# Patient Record
Sex: Female | Born: 1980 | Race: White | Hispanic: No | Marital: Married | Smoking: Former smoker
Health system: Southern US, Community
[De-identification: ages and names within clinical notes are randomized; demographics above are authoritative.]

## PROBLEM LIST (undated history)

## (undated) DIAGNOSIS — I499 Cardiac arrhythmia, unspecified: Secondary | ICD-10-CM

## (undated) DIAGNOSIS — R87619 Unspecified abnormal cytological findings in specimens from cervix uteri: Secondary | ICD-10-CM

## (undated) DIAGNOSIS — IMO0002 Reserved for concepts with insufficient information to code with codable children: Secondary | ICD-10-CM

## (undated) DIAGNOSIS — U071 COVID-19: Secondary | ICD-10-CM

## (undated) HISTORY — PX: WISDOM TOOTH EXTRACTION: SHX21

## (undated) HISTORY — DX: Unspecified abnormal cytological findings in specimens from cervix uteri: R87.619

## (undated) HISTORY — DX: Reserved for concepts with insufficient information to code with codable children: IMO0002

## (undated) HISTORY — DX: COVID-19: U07.1

---

## 2009-12-26 ENCOUNTER — Ambulatory Visit: Payer: Self-pay | Admitting: Obstetrics & Gynecology

## 2010-01-16 ENCOUNTER — Ambulatory Visit: Payer: Self-pay | Admitting: Obstetrics & Gynecology

## 2010-09-24 NOTE — Assessment & Plan Note (Signed)
[name], [name]             ACCOUNT NO.:  [id number]   MEDICAL RECORD NO.:  [id number]          PATIENT TYPE:  POB   LOCATION:  [facility] [place]         FACILITY:  [facility]   PHYSICIAN:  Allie Bossier, MD        DATE OF BIRTH:  1980/11/11   DATE OF SERVICE:  12/26/2009                                  CLINIC NOTE   Ms. Deborah Edwards is a 30 year old married white, gravida 3, para 3.  She has  sons that are ages 67 months 4 years and 8 years.  She comes in because  she would like to restart her birth control pills.  She had run out of  birth control pills last month and had unprotected intercourse on the  15th of this month and took the Plan B preparation on the 16th of this  month (yesterday).  She does desire children within the next 5 years,  but wants to start her birth control pills back as she is not pregnant.   PAST MEDICAL HISTORY:  None.   PAST SURGICAL HISTORY:  None.   SOCIAL HISTORY:  She drinks occasional alcohol, but she quit regular  smoking about 8 years ago.   REVIEW OF SYSTEMS:  She works at [employer] and she is married for the  last 5 years and she denies dyspareunia.   MEDICATIONS:  She is not currently taking any planning except the Plan B  yesterday and she would like to restart her Necon.   PHYSICAL EXAMINATION:  GENERAL:  Well-nourished, well-hydrated pleasant  white female.  VITAL SIGNS:  Height 5 feet 9 inches, weight 143 pounds, blood pressure  111/71, pulse 67.  HEENT:  Normal.  HEART:  Regular rate and rhythm.  LUNGS:  Clear to auscultation bilaterally.  ABDOMEN:  Scaphoid benign.  EXTERNAL GENITALIA:  No lesions.  Cervix parous.  She has an excellent  pelvis for giving birth.  Her uterus is normal size and shape,  anteverted, mobile.  Adnexa are nontender.  No masses.   ASSESSMENT AND PLAN:  1. Annual exam.  I have checked a Pap smear and recommended self-      breast and self-vulvar exams.  2. Birth control.  I have given her a prescription for  Necon.  She      will start this within the next day of her next      menstrual period.  If she does not start her period and her      pregnancy test is positive, she should return for prenatal care.      Allie Bossier, MD     MCD/MEDQ  D:  12/26/2009  T:  12/27/2009  Job:  [id number]

## 2010-10-21 ENCOUNTER — Ambulatory Visit (INDEPENDENT_AMBULATORY_CARE_PROVIDER_SITE_OTHER): Payer: Managed Care, Other (non HMO)

## 2010-10-21 ENCOUNTER — Other Ambulatory Visit: Payer: Self-pay | Admitting: Physician Assistant

## 2010-10-21 DIAGNOSIS — R87612 Low grade squamous intraepithelial lesion on cytologic smear of cervix (LGSIL): Secondary | ICD-10-CM

## 2010-10-21 DIAGNOSIS — Z3041 Encounter for surveillance of contraceptive pills: Secondary | ICD-10-CM

## 2010-10-21 DIAGNOSIS — Z113 Encounter for screening for infections with a predominantly sexual mode of transmission: Secondary | ICD-10-CM

## 2011-08-28 ENCOUNTER — Telehealth: Payer: Self-pay | Admitting: *Deleted

## 2011-08-28 ENCOUNTER — Emergency Department (INDEPENDENT_AMBULATORY_CARE_PROVIDER_SITE_OTHER)
Admission: EM | Admit: 2011-08-28 | Discharge: 2011-08-28 | Disposition: A | Discharge status: Home / Self Care | Payer: Managed Care, Other (non HMO) | Source: Home / Self Care | Attending: Emergency Medicine | Admitting: Emergency Medicine

## 2011-08-28 ENCOUNTER — Encounter: Payer: Self-pay | Admitting: *Deleted

## 2011-08-28 DIAGNOSIS — R319 Hematuria, unspecified: Secondary | ICD-10-CM

## 2011-08-28 DIAGNOSIS — N390 Urinary tract infection, site not specified: Secondary | ICD-10-CM

## 2011-08-28 HISTORY — DX: Cardiac arrhythmia, unspecified: I49.9

## 2011-08-28 LAB — POCT URINALYSIS DIP (MANUAL ENTRY)
Nitrite, UA: NEGATIVE
Spec Grav, UA: >=1.03 (ref 1.005–1.03)
Urobilinogen, UA: 0.2 (ref 0–1)
pH, UA: 6 (ref 5–8)

## 2011-08-28 MED ORDER — CIPROFLOXACIN HCL 500 MG PO TABS: 500.0000 mg | ORAL_TABLET | Freq: Two times a day (BID) | ORAL | Status: AC

## 2011-08-28 NOTE — Telephone Encounter (Signed)
Pt called with c/os UTI symptoms and some bleeding which is not her period.  I recommended that pt go to Urgent Care for UA and poss C&S.

## 2011-08-28 NOTE — ED Provider Notes (Signed)
History     CSN: [id number]  Arrival date & time 08/28/11  1014   First MD Initiated Contact with Patient 08/28/11 1044      Chief Complaint  Patient presents with  . Hematuria    (Consider location/radiation/quality/duration/timing/severity/associated sxs/prior treatment) HPI Deborah Edwards is a 31 y.o. female who presents today with UTI symptoms for 2 days.  + dysuria & pressure No frequency No urgency + hematuria No vaginal discharge No fever/chills +/- lower abdominal pain No back pain No fatigue    Past Medical History  Diagnosis Date  . Irregular heart beat     History reviewed. No pertinent past surgical history.  Family History  Problem Relation Age of Onset  . Heart failure Father     History  Substance Use Topics  . Smoking status: Former Games developer  . Smokeless tobacco: Not on file  . Alcohol Use: No    OB History    Grav Para Term Preterm Abortions TAB SAB Ect Mult Living                  Review of Systems  All other systems reviewed and are negative.    Allergies  Review of patient's allergies indicates no known allergies.  Home Medications   Current Outpatient Rx  Name Route Sig Dispense Refill  . CIPROFLOXACIN HCL 500 MG PO TABS Oral Take 1 tablet (500 mg total) by mouth 2 (two) times daily. 10 tablet 0    BP 100/69  Pulse 75  Temp(Src) 98.4 F (36.9 C) (Oral)  Resp 16  Ht 5\' 9"  (1.753 m)  Wt 145 lb (65.772 kg)  BMI 21.41 kg/m2  SpO2 100%  LMP 08/17/2011  Physical Exam  Nursing note and vitals reviewed. Constitutional: She is oriented to person, place, and time. She appears well-developed and well-nourished.  HENT:  Head: Normocephalic and atraumatic.  Eyes: No scleral icterus.  Neck: Neck supple.  Cardiovascular: Regular rhythm and normal heart sounds.   Pulmonary/Chest: Effort normal and breath sounds normal. No respiratory distress.  Abdominal: Soft. Normal appearance and bowel sounds are normal. She exhibits no mass. There  is no rebound, no guarding and no CVA tenderness.  Neurological: She is alert and oriented to person, place, and time.  Skin: Skin is warm and dry.  Psychiatric: She has a normal mood and affect. Her speech is normal.    ED Course  Procedures (including critical care time)   Labs Reviewed  POCT URINALYSIS DIP (MANUAL ENTRY)  POCT URINE PREGNANCY  URINE CULTURE   No results found.   1. Urinary tract infection, site not specified   2. Hematuria       MDM  1) Take the prescribed antibiotic as directed. 2) A urinalysis was done in clinic.  A urine culture is pending. 3) Follow up with your PCP or urologist if not improving or if worsening symptoms.   The patient also asked about more in her stomach and I advised her with her fair skin that she should likely be seen by a dermatologist yearly for a skin check.  The mole briefly and it does not look cancerous and is a raised area about half a centimeter on her stomach that has been present for 20+ years  Marlaine Hind, MD 08/28/11 1058

## 2011-08-31 ENCOUNTER — Telehealth: Payer: Self-pay | Admitting: Family Medicine

## 2011-08-31 LAB — URINE CULTURE: Colony Count: >=100000

## 2011-10-24 ENCOUNTER — Other Ambulatory Visit: Payer: Self-pay | Admitting: Family

## 2011-10-24 ENCOUNTER — Ambulatory Visit (INDEPENDENT_AMBULATORY_CARE_PROVIDER_SITE_OTHER): Payer: Managed Care, Other (non HMO) | Admitting: Family

## 2011-10-24 ENCOUNTER — Other Ambulatory Visit: Payer: Self-pay | Admitting: Obstetrics & Gynecology

## 2011-10-24 VITALS — BP 109/67 | Temp 98.0°F | Wt 146.0 lb

## 2011-10-24 DIAGNOSIS — Z1151 Encounter for screening for human papillomavirus (HPV): Secondary | ICD-10-CM

## 2011-10-24 DIAGNOSIS — N6310 Unspecified lump in the right breast, unspecified quadrant: Secondary | ICD-10-CM

## 2011-10-24 DIAGNOSIS — Z124 Encounter for screening for malignant neoplasm of cervix: Secondary | ICD-10-CM

## 2011-10-24 DIAGNOSIS — Z3480 Encounter for supervision of other normal pregnancy, unspecified trimester: Secondary | ICD-10-CM

## 2011-10-24 DIAGNOSIS — N63 Unspecified lump in unspecified breast: Secondary | ICD-10-CM

## 2011-10-24 DIAGNOSIS — Z113 Encounter for screening for infections with a predominantly sexual mode of transmission: Secondary | ICD-10-CM

## 2011-10-24 DIAGNOSIS — Z3490 Encounter for supervision of normal pregnancy, unspecified, unspecified trimester: Secondary | ICD-10-CM | POA: Insufficient documentation

## 2011-10-24 MED ORDER — CONCEPT DHA 53.5-38-1 MG PO CAPS: 1.0000 | ORAL_CAPSULE | Freq: Every day | ORAL | Status: DC

## 2011-10-24 NOTE — Addendum Note (Signed)
Addended by: Granville Lewis on: 10/24/2011 12:46 PM   Modules accepted: Orders

## 2011-10-24 NOTE — Patient Instructions (Signed)
Pregnancy - First Trimester  During sexual intercourse, millions of sperm go into the vagina. Only 1 sperm will penetrate and fertilize the female egg while it is in the Fallopian tube. One week later, the fertilized egg implants into the wall of the uterus. An embryo begins to develop into a baby. At 6 to 8 weeks, the eyes and face are formed and the heartbeat can be seen on ultrasound. At the end of 12 weeks (first trimester), all the baby's organs are formed. Now that you are pregnant, you will want to do everything you can to have a healthy baby. Two of the most important things are to get good prenatal care and follow your caregiver's instructions. Prenatal care is all the medical care you receive before the baby's birth. It is given to prevent, find, and treat problems during the pregnancy and childbirth.  PRENATAL EXAMS   During prenatal visits, your weight, blood pressure and urine are checked. This is done to make sure you are healthy and progressing normally during the pregnancy.   A pregnant woman should gain 25 to 35 pounds during the pregnancy. However, if you are over weight or underweight, your caregiver will advise you regarding your weight.   Your caregiver will ask and answer questions for you.   Blood work, cervical cultures, other necessary tests and a Pap test are done during your prenatal exams. These tests are done to check on your health and the probable health of your baby. Tests are strongly recommended and done for HIV with your permission. This is the virus that causes AIDS. These tests are done because medications can be given to help prevent your baby from being born with this infection should you have been infected without knowing it. Blood work is also used to find out your blood type, previous infections and follow your blood levels (hemoglobin).   Low hemoglobin (anemia) is common during pregnancy. Iron and vitamins are given to help prevent this. Later in the pregnancy, blood  tests for diabetes will be done along with any other tests if any problems develop. You may need tests to make sure you and the baby are doing well.   You may need other tests to make sure you and the baby are doing well.  CHANGES DURING THE FIRST TRIMESTER (THE FIRST 3 MONTHS OF PREGNANCY)  Your body goes through many changes during pregnancy. They vary from person to person. Talk to your caregiver about changes you notice and are concerned about. Changes can include:   Your menstrual period stops.   The egg and sperm carry the genes that determine what you look like. Genes from you and your partner are forming a baby. The female genes determine whether the baby is a boy or a girl.   Your body increases in girth and you may feel bloated.   Feeling sick to your stomach (nauseous) and throwing up (vomiting). If the vomiting is uncontrollable, call your caregiver.   Your breasts will begin to enlarge and become tender.   Your nipples may stick out more and become darker.   The need to urinate more. Painful urination may mean you have a bladder infection.   Tiring easily.   Loss of appetite.   Cravings for certain kinds of food.   At first, you may gain or lose a couple of pounds.   You may have changes in your emotions from day to day (excited to be pregnant or concerned something may go wrong with   the pregnancy and baby).   You may have more vivid and strange dreams.  HOME CARE INSTRUCTIONS    It is very important to avoid all smoking, alcohol and un-prescribed drugs during your pregnancy. These affect the formation and growth of the baby. Avoid chemicals while pregnant to ensure the delivery of a healthy infant.   Start your prenatal visits by the 12th week of pregnancy. They are usually scheduled monthly at first, then more often in the last 2 months before delivery. Keep your caregiver's appointments. Follow your caregiver's instructions regarding medication use, blood and lab tests, exercise, and  diet.   During pregnancy, you are providing food for you and your baby. Eat regular, well-balanced meals. Choose foods such as meat, fish, milk and other low fat dairy products, vegetables, fruits, and whole-grain breads and cereals. Your caregiver will tell you of the ideal weight gain.   You can help morning sickness by keeping soda crackers at the bedside. Eat a couple before arising in the morning. You may want to use the crackers without salt on them.   Eating 4 to 5 small meals rather than 3 large meals a day also may help the nausea and vomiting.   Drinking liquids between meals instead of during meals also seems to help nausea and vomiting.   A physical sexual relationship may be continued throughout pregnancy if there are no other problems. Problems may be early (premature) leaking of amniotic fluid from the membranes, vaginal bleeding, or belly (abdominal) pain.   Exercise regularly if there are no restrictions. Check with your caregiver or physical therapist if you are unsure of the safety of some of your exercises. Greater weight gain will occur in the last 2 trimesters of pregnancy. Exercising will help:   Control your weight.   Keep you in shape.   Prepare you for labor and delivery.   Help you lose your pregnancy weight after you deliver your baby.   Wear a good support or jogging bra for breast tenderness during pregnancy. This may help if worn during sleep too.   Ask when prenatal classes are available. Begin classes when they are offered.   Do not use hot tubs, steam rooms or saunas.   Wear your seat belt when driving. This protects you and your baby if you are in an accident.   Avoid raw meat, uncooked cheese, cat litter boxes and soil used by cats throughout the pregnancy. These carry germs that can cause birth defects in the baby.   The first trimester is a good time to visit your dentist for your dental health. Getting your teeth cleaned is OK. Use a softer toothbrush and brush  gently during pregnancy.   Ask for help if you have financial, counseling or nutritional needs during pregnancy. Your caregiver will be able to offer counseling for these needs as well as refer you for other special needs.   Do not take any medications or herbs unless told by your caregiver.   Inform your caregiver if there is any mental or physical domestic violence.   Make a list of emergency phone numbers of family, friends, hospital, and police and fire departments.   Write down your questions. Take them to your prenatal visit.   Do not douche.   Do not cross your legs.   If you have to stand for long periods of time, rotate you feet or take small steps in a circle.   You may have more vaginal secretions that may   require a sanitary pad. Do not use tampons or scented sanitary pads.  MEDICATIONS AND DRUG USE IN PREGNANCY   Take prenatal vitamins as directed. The vitamin should contain 1 milligram of folic acid. Keep all vitamins out of reach of children. Only a couple vitamins or tablets containing iron may be fatal to a baby or young child when ingested.   Avoid use of all medications, including herbs, over-the-counter medications, not prescribed or suggested by your caregiver. Only take over-the-counter or prescription medicines for pain, discomfort, or fever as directed by your caregiver. Do not use aspirin, ibuprofen, or naproxen unless directed by your caregiver.   Let your caregiver also know about herbs you may be using.   Alcohol is related to a number of birth defects. This includes fetal alcohol syndrome. All alcohol, in any form, should be avoided completely. Smoking will cause low birth rate and premature babies.   Street or illegal drugs are very harmful to the baby. They are absolutely forbidden. A baby born to an addicted mother will be addicted at birth. The baby will go through the same withdrawal an adult does.   Let your caregiver know about any medications that you have to take  and for what reason you take them.  MISCARRIAGE IS COMMON DURING PREGNANCY  A miscarriage does not mean you did something wrong. It is not a reason to worry about getting pregnant again. Your caregiver will help you with questions you may have. If you have a miscarriage, you may need minor surgery.  SEEK MEDICAL CARE IF:   You have any concerns or worries during your pregnancy. It is better to call with your questions if you feel they cannot wait, rather than worry about them.  SEEK IMMEDIATE MEDICAL CARE IF:    An unexplained oral temperature above 102 F (38.9 C) develops, or as your caregiver suggests.   You have leaking of fluid from the vagina (birth canal). If leaking membranes are suspected, take your temperature and inform your caregiver of this when you call.   There is vaginal spotting or bleeding. Notify your caregiver of the amount and how many pads are used.   You develop a bad smelling vaginal discharge with a change in the color.   You continue to feel sick to your stomach (nauseated) and have no relief from remedies suggested. You vomit blood or coffee ground-like materials.   You lose more than 2 pounds of weight in 1 week.   You gain more than 2 pounds of weight in 1 week and you notice swelling of your face, hands, feet, or legs.   You gain 5 pounds or more in 1 week (even if you do not have swelling of your hands, face, legs, or feet).   You get exposed to German measles and have never had them.   You are exposed to fifth disease or chickenpox.   You develop belly (abdominal) pain. Round ligament discomfort is a common non-cancerous (benign) cause of abdominal pain in pregnancy. Your caregiver still must evaluate this.   You develop headache, fever, diarrhea, pain with urination, or shortness of breath.   You fall or are in a car accident or have any kind of trauma.   There is mental or physical violence in your home.  Document Released: 04/22/2001 Document Revised: 04/17/2011  Document Reviewed: 10/24/2008  [employer] Patient Information 2012 [employer].

## 2011-10-24 NOTE — Progress Notes (Signed)
   Subjective:    Deborah Edwards is a [id number] Unknown being seen today for her first obstetrical visit.  Her obstetrical history is significant for no complications. Patient may intend to breast feed. Pregnancy history fully reviewed.  Patient reports no complaints.  Filed Vitals:   10/24/11 1120  BP: 109/67  Temp: 98 F (36.7 C)  Weight: 146 lb (66.225 kg)    HISTORY: OB History    Grav Para Term Preterm Abortions TAB SAB Ect Mult Living   4 3 3       3      # Outc Date GA Lbr Len/2nd Wgt Sex Del Anes PTL Lv   1 TRM 9/03 [redacted]w[redacted]d  8lb10oz(3.912kg) M SVD EPI No Yes   2 TRM 9/07 [redacted]w[redacted]d  8lb14oz(4.026kg) M SVD EPI No Yes   Comments: spontaneous labor   3 TRM 2/10 [redacted]w[redacted]d  8lb5oz(3.771kg) M SVD EPI No Yes   Comments: spontaneous labor   4 CUR              Past Medical History  Diagnosis Date  . Irregular heart beat   . Abnormal Pap smear     colpo ok; 2011   Past Surgical History  Procedure Date  . Wisdom tooth extraction    Family History  Problem Relation Age of Onset  . Heart failure Father   . Diabetes Maternal Grandmother   . Cancer Neg Hx      Exam    Uterus:   Palpates 4-6 wks size  Pelvic Exam:    Perineum: No Hemorrhoids   Vulva: normal   Vagina:  normal mucosa, normal discharge, scant blood after pap smear   pH: n/q   Cervix: no cervical motion tenderness   Adnexa: normal adnexa and no mass, fullness, tenderness   Bony Pelvis: proven to 8lbs 14oz  System: Breast:  No nipple retraction or dimpling, No nipple discharge or bleeding, positive findings: 2 cm, smooth, hard and non-tender nodule located on the right upper inner quadrant; 11'oclock   Skin: normal coloration and turgor, no rashes    Neurologic: oriented, normal, normal mood, gait normal; reflexes normal and symmetric   Extremities: normal strength, tone, and muscle mass   HEENT extra ocular movement intact, neck supple with midline trachea and thyroid without masses   Mouth/Teeth mucous  membranes moist, pharynx normal without lesions   Neck supple and no masses   Cardiovascular: regular rate and rhythm   Respiratory:  appears well, vitals normal, no respiratory distress, acyanotic, normal RR, ear and throat exam is normal, neck free of mass or lymphadenopathy, chest clear, no wheezing, crepitations, rhonchi, normal symmetric air entry   Abdomen: soft, non-tender; bowel sounds normal; no masses,  no organomegaly   Urinary: urethral meatus normal      Assessment:    Pregnancy: [id number] @ 6.0 wks by ultrasound today  Right Breast Mass There is no problem list on file for this patient.       Plan:     Initial labs drawn. Prenatal vitamins. Problem list reviewed and updated. Genetic Screening discussed Integrated Screen: requested.  Ultrasound discussed; fetal survey: not due.  Follow up in 4 weeks. Refer to breast center.  [facility] 10/24/2011  See not below:  Pt with uncertain LMP; pregnancy history reviewed, no complications, all NSVD, all boys!!!  Husband from [place].  Reviewed practice information and OB visit schedule; discussed genetic screening, desires integrated.  OB labs obtained; pap smear collected.

## 2011-10-24 NOTE — Progress Notes (Signed)
p-[id number] 

## 2011-10-25 LAB — OBSTETRIC PANEL
Antibody Screen: NEGATIVE
Basophils Absolute: 0 10*3/uL (ref 0.0–0.1)
Eosinophils Absolute: 0.1 10*3/uL (ref 0.0–0.7)
Eosinophils Relative: 1 % (ref 0–5)
HCT: 38.7 % (ref 36.0–46.0)
Lymphs Abs: 2.8 10*3/uL (ref 0.7–4.0)
MCH: 29.4 pg (ref 26.0–34.0)
MCV: 86.8 fL (ref 78.0–100.0)
Monocytes Absolute: 0.5 10*3/uL (ref 0.1–1.0)
Platelets: 361 10*3/uL (ref 150–400)
RDW: 13.4 % (ref 11.5–15.5)
Rubella: 77.3 IU/mL — ABNORMAL HIGH

## 2011-10-26 LAB — CULTURE, URINE COMPREHENSIVE: Colony Count: NO GROWTH

## 2011-10-27 ENCOUNTER — Ambulatory Visit
Admission: RE | Admit: 2011-10-27 | Discharge: 2011-10-27 | Disposition: A | Discharge status: Home / Self Care | Payer: Commercial Indemnity | Source: Ambulatory Visit | Attending: Obstetrics & Gynecology | Admitting: Obstetrics & Gynecology

## 2011-10-27 DIAGNOSIS — N63 Unspecified lump in unspecified breast: Secondary | ICD-10-CM

## 2011-11-17 ENCOUNTER — Encounter: Payer: Self-pay | Admitting: Family

## 2011-12-05 ENCOUNTER — Ambulatory Visit (INDEPENDENT_AMBULATORY_CARE_PROVIDER_SITE_OTHER): Payer: Commercial Indemnity | Admitting: Physician Assistant

## 2011-12-05 DIAGNOSIS — Z3480 Encounter for supervision of other normal pregnancy, unspecified trimester: Secondary | ICD-10-CM

## 2011-12-05 NOTE — Progress Notes (Signed)
No complaints. Unsure of desire for First Screen. Will discuss with husband and contact office to schedule, prn. Desires 24 hour discharge from hospital PP. Unable to doppler today. +FM and Cardiac activity on [place]. Anticipatory guidance.

## 2011-12-05 NOTE — Progress Notes (Signed)
p-77 

## 2011-12-05 NOTE — Patient Instructions (Signed)
Pregnancy - Second Trimester The second trimester of pregnancy (3 to 6 months) is a period of rapid growth for you and your baby. At the end of the sixth month, your baby is about 9 inches long and weighs 1 1/2 pounds. You will begin to feel the baby move between 18 and 20 weeks of the pregnancy. This is called quickening. Weight gain is faster. A clear fluid (colostrum) may leak out of your breasts. You may feel small contractions of the womb (uterus). This is known as false labor or Braxton-Hicks contractions. This is like a practice for labor when the baby is ready to be born. Usually, the problems with morning sickness have usually passed by the end of your first trimester. Some women develop small dark blotches (called cholasma, mask of pregnancy) on their face that usually goes away after the baby is born. Exposure to the sun makes the blotches worse. Acne may also develop in some pregnant women and pregnant women who have acne, may find that it goes away. PRENATAL EXAMS  Blood work may continue to be done during prenatal exams. These tests are done to check on your health and the probable health of your baby. Blood work is used to follow your blood levels (hemoglobin). Anemia (low hemoglobin) is common during pregnancy. Iron and vitamins are given to help prevent this. You will also be checked for diabetes between 24 and 28 weeks of the pregnancy. Some of the previous blood tests may be repeated.   The size of the uterus is measured during each visit. This is to make sure that the baby is continuing to grow properly according to the dates of the pregnancy.   Your blood pressure is checked every prenatal visit. This is to make sure you are not getting toxemia.   Your urine is checked to make sure you do not have an infection, diabetes or protein in the urine.   Your weight is checked often to make sure gains are happening at the suggested rate. This is to ensure that both you and your baby are  growing normally.   Sometimes, an ultrasound is performed to confirm the proper growth and development of the baby. This is a test which bounces harmless sound waves off the baby so your caregiver can more accurately determine due dates.  Sometimes, a specialized test is done on the amniotic fluid surrounding the baby. This test is called an amniocentesis. The amniotic fluid is obtained by sticking a needle into the belly (abdomen). This is done to check the chromosomes in instances where there is a concern about possible genetic problems with the baby. It is also sometimes done near the end of pregnancy if an early delivery is required. In this case, it is done to help make sure the baby's lungs are mature enough for the baby to live outside of the womb. CHANGES OCCURING IN THE SECOND TRIMESTER OF PREGNANCY Your body goes through many changes during pregnancy. They vary from person to person. Talk to your caregiver about changes you notice that you are concerned about.  During the second trimester, you will likely have an increase in your appetite. It is normal to have cravings for certain foods. This varies from person to person and pregnancy to pregnancy.   Your lower abdomen will begin to bulge.   You may have to urinate more often because the uterus and baby are pressing on your bladder. It is also common to get more bladder infections during pregnancy (  pain with urination). You can help this by drinking lots of fluids and emptying your bladder before and after intercourse.   You may begin to get stretch marks on your hips, abdomen, and breasts. These are normal changes in the body during pregnancy. There are no exercises or medications to take that prevent this change.   You may begin to develop swollen and bulging veins (varicose veins) in your legs. Wearing support hose, elevating your feet for 15 minutes, 3 to 4 times a day and limiting salt in your diet helps lessen the problem.    Heartburn may develop as the uterus grows and pushes up against the stomach. Antacids recommended by your caregiver helps with this problem. Also, eating smaller meals 4 to 5 times a day helps.   Constipation can be treated with a stool softener or adding bulk to your diet. Drinking lots of fluids, vegetables, fruits, and whole grains are helpful.   Exercising is also helpful. If you have been very active up until your pregnancy, most of these activities can be continued during your pregnancy. If you have been less active, it is helpful to start an exercise program such as walking.   Hemorrhoids (varicose veins in the rectum) may develop at the end of the second trimester. Warm sitz baths and hemorrhoid cream recommended by your caregiver helps hemorrhoid problems.   Backaches may develop during this time of your pregnancy. Avoid heavy lifting, wear low heal shoes and practice good posture to help with backache problems.   Some pregnant women develop tingling and numbness of their hand and fingers because of swelling and tightening of ligaments in the wrist (carpel tunnel syndrome). This goes away after the baby is born.   As your breasts enlarge, you may have to get a bigger bra. Get a comfortable, cotton, support bra. Do not get a nursing bra until the last month of the pregnancy if you will be nursing the baby.   You may get a dark line from your belly button to the pubic area called the linea nigra.   You may develop rosy cheeks because of increase blood flow to the face.   You may develop spider looking lines of the face, neck, arms and chest. These go away after the baby is born.  HOME CARE INSTRUCTIONS   It is extremely important to avoid all smoking, herbs, alcohol, and unprescribed drugs during your pregnancy. These chemicals affect the formation and growth of the baby. Avoid these chemicals throughout the pregnancy to ensure the delivery of a healthy infant.   Most of your home  care instructions are the same as suggested for the first trimester of your pregnancy. Keep your caregiver's appointments. Follow your caregiver's instructions regarding medication use, exercise and diet.   During pregnancy, you are providing food for you and your baby. Continue to eat regular, well-balanced meals. Choose foods such as meat, fish, milk and other low fat dairy products, vegetables, fruits, and whole-grain breads and cereals. Your caregiver will tell you of the ideal weight gain.   A physical sexual relationship may be continued up until near the end of pregnancy if there are no other problems. Problems could include early (premature) leaking of amniotic fluid from the membranes, vaginal bleeding, abdominal pain, or other medical or pregnancy problems.   Exercise regularly if there are no restrictions. Check with your caregiver if you are unsure of the safety of some of your exercises. The greatest weight gain will occur in the   last 2 trimesters of pregnancy. Exercise will help you:   Control your weight.   Get you in shape for labor and delivery.   Lose weight after you have the baby.   Wear a good support or jogging bra for breast tenderness during pregnancy. This may help if worn during sleep. Pads or tissues may be used in the bra if you are leaking colostrum.   Do not use hot tubs, steam rooms or saunas throughout the pregnancy.   Wear your seat belt at all times when driving. This protects you and your baby if you are in an accident.   Avoid raw meat, uncooked cheese, cat litter boxes and soil used by cats. These carry germs that can cause birth defects in the baby.   The second trimester is also a good time to visit your dentist for your dental health if this has not been done yet. Getting your teeth cleaned is OK. Use a soft toothbrush. Brush gently during pregnancy.   It is easier to loose urine during pregnancy. Tightening up and strengthening the pelvic muscles will  help with this problem. Practice stopping your urination while you are going to the bathroom. These are the same muscles you need to strengthen. It is also the muscles you would use as if you were trying to stop from passing gas. You can practice tightening these muscles up 10 times a set and repeating this about 3 times per day. Once you know what muscles to tighten up, do not perform these exercises during urination. It is more likely to contribute to an infection by backing up the urine.   Ask for help if you have financial, counseling or nutritional needs during pregnancy. Your caregiver will be able to offer counseling for these needs as well as refer you for other special needs.   Your skin may become oily. If so, wash your face with mild soap, use non-greasy moisturizer and oil or cream based makeup.  MEDICATIONS AND DRUG USE IN PREGNANCY  Take prenatal vitamins as directed. The vitamin should contain 1 milligram of folic acid. Keep all vitamins out of reach of children. Only a couple vitamins or tablets containing iron may be fatal to a baby or young child when ingested.   Avoid use of all medications, including herbs, over-the-counter medications, not prescribed or suggested by your caregiver. Only take over-the-counter or prescription medicines for pain, discomfort, or fever as directed by your caregiver. Do not use aspirin.   Let your caregiver also know about herbs you may be using.   Alcohol is related to a number of birth defects. This includes fetal alcohol syndrome. All alcohol, in any form, should be avoided completely. Smoking will cause low birth rate and premature babies.   Street or illegal drugs are very harmful to the baby. They are absolutely forbidden. A baby born to an addicted mother will be addicted at birth. The baby will go through the same withdrawal an adult does.  SEEK MEDICAL CARE IF:  You have any concerns or worries during your pregnancy. It is better to call with  your questions if you feel they cannot wait, rather than worry about them. SEEK IMMEDIATE MEDICAL CARE IF:   An unexplained oral temperature above 102 F (38.9 C) develops, or as your caregiver suggests.   You have leaking of fluid from the vagina (birth canal). If leaking membranes are suspected, take your temperature and tell your caregiver of this when you call.   There   is vaginal spotting, bleeding, or passing clots. Tell your caregiver of the amount and how many pads are used. Light spotting in pregnancy is common, especially following intercourse.   You develop a bad smelling vaginal discharge with a change in the color from clear to white.   You continue to feel sick to your stomach (nauseated) and have no relief from remedies suggested. You vomit blood or coffee ground-like materials.   You lose more than 2 pounds of weight or gain more than 2 pounds of weight over 1 week, or as suggested by your caregiver.   You notice swelling of your face, hands, feet, or legs.   You get exposed to German measles and have never had them.   You are exposed to fifth disease or chickenpox.   You develop belly (abdominal) pain. Round ligament discomfort is a common non-cancerous (benign) cause of abdominal pain in pregnancy. Your caregiver still must evaluate you.   You develop a bad headache that does not go away.   You develop fever, diarrhea, pain with urination, or shortness of breath.   You develop visual problems, blurry, or double vision.   You fall or are in a car accident or any kind of trauma.   There is mental or physical violence at home.  Document Released: 04/22/2001 Document Revised: 04/17/2011 Document Reviewed: 10/25/2008 [employer] Patient Information 2012 [employer]. 

## 2012-01-05 ENCOUNTER — Ambulatory Visit (INDEPENDENT_AMBULATORY_CARE_PROVIDER_SITE_OTHER): Payer: Commercial Indemnity | Admitting: Advanced Practice Midwife

## 2012-01-05 VITALS — BP 110/71 | Wt 157.0 lb

## 2012-01-05 DIAGNOSIS — Z3480 Encounter for supervision of other normal pregnancy, unspecified trimester: Secondary | ICD-10-CM

## 2012-01-05 DIAGNOSIS — Z3490 Encounter for supervision of normal pregnancy, unspecified, unspecified trimester: Secondary | ICD-10-CM

## 2012-01-05 NOTE — Patient Instructions (Signed)
Pregnancy - Second Trimester The second trimester of pregnancy (3 to 6 months) is a period of rapid growth for you and your baby. At the end of the sixth month, your baby is about 9 inches long and weighs 1 1/2 pounds. You will begin to feel the baby move between 18 and 20 weeks of the pregnancy. This is called quickening. Weight gain is faster. A clear fluid (colostrum) may leak out of your breasts. You may feel small contractions of the womb (uterus). This is known as false labor or Braxton-Hicks contractions. This is like a practice for labor when the baby is ready to be born. Usually, the problems with morning sickness have usually passed by the end of your first trimester. Some women develop small dark blotches (called cholasma, mask of pregnancy) on their face that usually goes away after the baby is born. Exposure to the sun makes the blotches worse. Acne may also develop in some pregnant women and pregnant women who have acne, may find that it goes away. PRENATAL EXAMS  Blood work may continue to be done during prenatal exams. These tests are done to check on your health and the probable health of your baby. Blood work is used to follow your blood levels (hemoglobin). Anemia (low hemoglobin) is common during pregnancy. Iron and vitamins are given to help prevent this. You will also be checked for diabetes between 24 and 28 weeks of the pregnancy. Some of the previous blood tests may be repeated.   The size of the uterus is measured during each visit. This is to make sure that the baby is continuing to grow properly according to the dates of the pregnancy.   Your blood pressure is checked every prenatal visit. This is to make sure you are not getting toxemia.   Your urine is checked to make sure you do not have an infection, diabetes or protein in the urine.   Your weight is checked often to make sure gains are happening at the suggested rate. This is to ensure that both you and your baby are  growing normally.   Sometimes, an ultrasound is performed to confirm the proper growth and development of the baby. This is a test which bounces harmless sound waves off the baby so your caregiver can more accurately determine due dates.  Sometimes, a specialized test is done on the amniotic fluid surrounding the baby. This test is called an amniocentesis. The amniotic fluid is obtained by sticking a needle into the belly (abdomen). This is done to check the chromosomes in instances where there is a concern about possible genetic problems with the baby. It is also sometimes done near the end of pregnancy if an early delivery is required. In this case, it is done to help make sure the baby's lungs are mature enough for the baby to live outside of the womb. CHANGES OCCURING IN THE SECOND TRIMESTER OF PREGNANCY Your body goes through many changes during pregnancy. They vary from person to person. Talk to your caregiver about changes you notice that you are concerned about.  During the second trimester, you will likely have an increase in your appetite. It is normal to have cravings for certain foods. This varies from person to person and pregnancy to pregnancy.   Your lower abdomen will begin to bulge.   You may have to urinate more often because the uterus and baby are pressing on your bladder. It is also common to get more bladder infections during pregnancy (  pain with urination). You can help this by drinking lots of fluids and emptying your bladder before and after intercourse.   You may begin to get stretch marks on your hips, abdomen, and breasts. These are normal changes in the body during pregnancy. There are no exercises or medications to take that prevent this change.   You may begin to develop swollen and bulging veins (varicose veins) in your legs. Wearing support hose, elevating your feet for 15 minutes, 3 to 4 times a day and limiting salt in your diet helps lessen the problem.    Heartburn may develop as the uterus grows and pushes up against the stomach. Antacids recommended by your caregiver helps with this problem. Also, eating smaller meals 4 to 5 times a day helps.   Constipation can be treated with a stool softener or adding bulk to your diet. Drinking lots of fluids, vegetables, fruits, and whole grains are helpful.   Exercising is also helpful. If you have been very active up until your pregnancy, most of these activities can be continued during your pregnancy. If you have been less active, it is helpful to start an exercise program such as walking.   Hemorrhoids (varicose veins in the rectum) may develop at the end of the second trimester. Warm sitz baths and hemorrhoid cream recommended by your caregiver helps hemorrhoid problems.   Backaches may develop during this time of your pregnancy. Avoid heavy lifting, wear low heal shoes and practice good posture to help with backache problems.   Some pregnant women develop tingling and numbness of their hand and fingers because of swelling and tightening of ligaments in the wrist (carpel tunnel syndrome). This goes away after the baby is born.   As your breasts enlarge, you may have to get a bigger bra. Get a comfortable, cotton, support bra. Do not get a nursing bra until the last month of the pregnancy if you will be nursing the baby.   You may get a dark line from your belly button to the pubic area called the linea nigra.   You may develop rosy cheeks because of increase blood flow to the face.   You may develop spider looking lines of the face, neck, arms and chest. These go away after the baby is born.  HOME CARE INSTRUCTIONS   It is extremely important to avoid all smoking, herbs, alcohol, and unprescribed drugs during your pregnancy. These chemicals affect the formation and growth of the baby. Avoid these chemicals throughout the pregnancy to ensure the delivery of a healthy infant.   Most of your home  care instructions are the same as suggested for the first trimester of your pregnancy. Keep your caregiver's appointments. Follow your caregiver's instructions regarding medication use, exercise and diet.   During pregnancy, you are providing food for you and your baby. Continue to eat regular, well-balanced meals. Choose foods such as meat, fish, milk and other low fat dairy products, vegetables, fruits, and whole-grain breads and cereals. Your caregiver will tell you of the ideal weight gain.   A physical sexual relationship may be continued up until near the end of pregnancy if there are no other problems. Problems could include early (premature) leaking of amniotic fluid from the membranes, vaginal bleeding, abdominal pain, or other medical or pregnancy problems.   Exercise regularly if there are no restrictions. Check with your caregiver if you are unsure of the safety of some of your exercises. The greatest weight gain will occur in the   last 2 trimesters of pregnancy. Exercise will help you:   Control your weight.   Get you in shape for labor and delivery.   Lose weight after you have the baby.   Wear a good support or jogging bra for breast tenderness during pregnancy. This may help if worn during sleep. Pads or tissues may be used in the bra if you are leaking colostrum.   Do not use hot tubs, steam rooms or saunas throughout the pregnancy.   Wear your seat belt at all times when driving. This protects you and your baby if you are in an accident.   Avoid raw meat, uncooked cheese, cat litter boxes and soil used by cats. These carry germs that can cause birth defects in the baby.   The second trimester is also a good time to visit your dentist for your dental health if this has not been done yet. Getting your teeth cleaned is OK. Use a soft toothbrush. Brush gently during pregnancy.   It is easier to loose urine during pregnancy. Tightening up and strengthening the pelvic muscles will  help with this problem. Practice stopping your urination while you are going to the bathroom. These are the same muscles you need to strengthen. It is also the muscles you would use as if you were trying to stop from passing gas. You can practice tightening these muscles up 10 times a set and repeating this about 3 times per day. Once you know what muscles to tighten up, do not perform these exercises during urination. It is more likely to contribute to an infection by backing up the urine.   Ask for help if you have financial, counseling or nutritional needs during pregnancy. Your caregiver will be able to offer counseling for these needs as well as refer you for other special needs.   Your skin may become oily. If so, wash your face with mild soap, use non-greasy moisturizer and oil or cream based makeup.  MEDICATIONS AND DRUG USE IN PREGNANCY  Take prenatal vitamins as directed. The vitamin should contain 1 milligram of folic acid. Keep all vitamins out of reach of children. Only a couple vitamins or tablets containing iron may be fatal to a baby or young child when ingested.   Avoid use of all medications, including herbs, over-the-counter medications, not prescribed or suggested by your caregiver. Only take over-the-counter or prescription medicines for pain, discomfort, or fever as directed by your caregiver. Do not use aspirin.   Let your caregiver also know about herbs you may be using.   Alcohol is related to a number of birth defects. This includes fetal alcohol syndrome. All alcohol, in any form, should be avoided completely. Smoking will cause low birth rate and premature babies.   Street or illegal drugs are very harmful to the baby. They are absolutely forbidden. A baby born to an addicted mother will be addicted at birth. The baby will go through the same withdrawal an adult does.  SEEK MEDICAL CARE IF:  You have any concerns or worries during your pregnancy. It is better to call with  your questions if you feel they cannot wait, rather than worry about them. SEEK IMMEDIATE MEDICAL CARE IF:   An unexplained oral temperature above 102 F (38.9 C) develops, or as your caregiver suggests.   You have leaking of fluid from the vagina (birth canal). If leaking membranes are suspected, take your temperature and tell your caregiver of this when you call.   There   is vaginal spotting, bleeding, or passing clots. Tell your caregiver of the amount and how many pads are used. Light spotting in pregnancy is common, especially following intercourse.   You develop a bad smelling vaginal discharge with a change in the color from clear to white.   You continue to feel sick to your stomach (nauseated) and have no relief from remedies suggested. You vomit blood or coffee ground-like materials.   You lose more than 2 pounds of weight or gain more than 2 pounds of weight over 1 week, or as suggested by your caregiver.   You notice swelling of your face, hands, feet, or legs.   You get exposed to German measles and have never had them.   You are exposed to fifth disease or chickenpox.   You develop belly (abdominal) pain. Round ligament discomfort is a common non-cancerous (benign) cause of abdominal pain in pregnancy. Your caregiver still must evaluate you.   You develop a bad headache that does not go away.   You develop fever, diarrhea, pain with urination, or shortness of breath.   You develop visual problems, blurry, or double vision.   You fall or are in a car accident or any kind of trauma.   There is mental or physical violence at home.  Document Released: 04/22/2001 Document Revised: 04/17/2011 Document Reviewed: 10/25/2008 [employer] Patient Information 2012 [employer]. 

## 2012-01-05 NOTE — Progress Notes (Signed)
Doing well. Will plan Anatomy US in 2-3 weeks. Will not want to find out gender until delivery.

## 2012-01-26 ENCOUNTER — Ambulatory Visit (HOSPITAL_COMMUNITY)
Admission: RE | Admit: 2012-01-26 | Discharge: 2012-01-26 | Disposition: A | Discharge status: Home / Self Care | Payer: Commercial Indemnity | Source: Ambulatory Visit | Attending: Advanced Practice Midwife | Admitting: Advanced Practice Midwife

## 2012-01-26 DIAGNOSIS — Z363 Encounter for antenatal screening for malformations: Secondary | ICD-10-CM | POA: Insufficient documentation

## 2012-01-26 DIAGNOSIS — O358XX0 Maternal care for other (suspected) fetal abnormality and damage, not applicable or unspecified: Secondary | ICD-10-CM | POA: Insufficient documentation

## 2012-01-26 DIAGNOSIS — Z3490 Encounter for supervision of normal pregnancy, unspecified, unspecified trimester: Secondary | ICD-10-CM

## 2012-01-26 DIAGNOSIS — Z1389 Encounter for screening for other disorder: Secondary | ICD-10-CM | POA: Insufficient documentation

## 2012-01-26 IMAGING — US US OB DETAIL+14 WK
2 series · 12 of 28 positions shown · non-contrast
Comparison: none

[Series 1: us ob detail +14 wk · 19 acquisitions, 2 frames shown (1 of 2)]
[im 5/19]
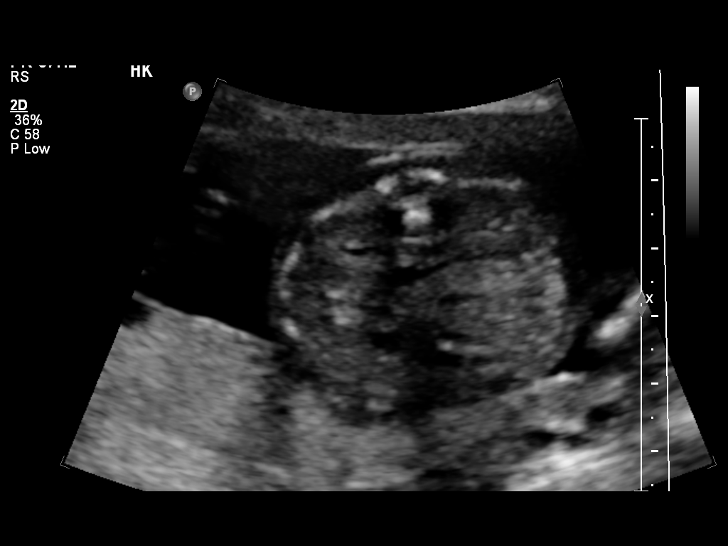
[im 14/19]
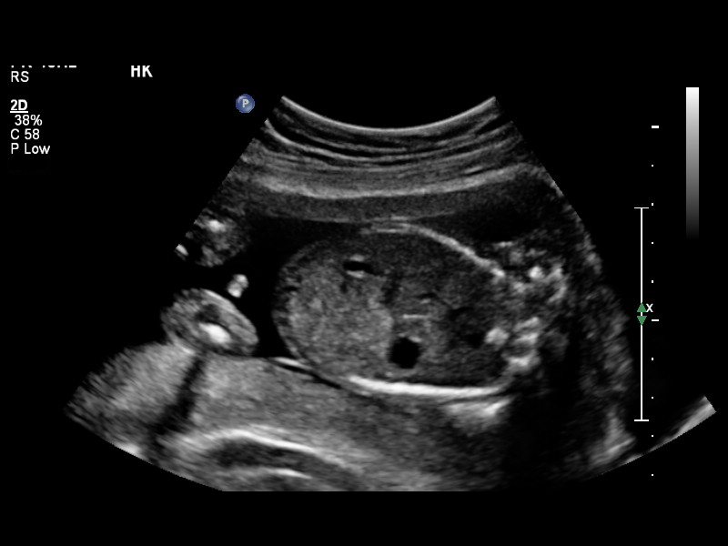

[Series 1: us ob detail +14 wk · 10 of 82 slices shown (2 of 2)]
[im 1/82]
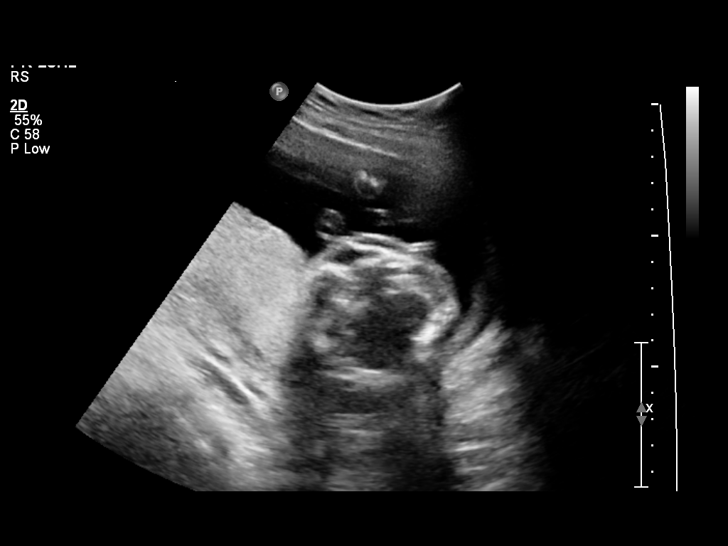
[im 12/82]
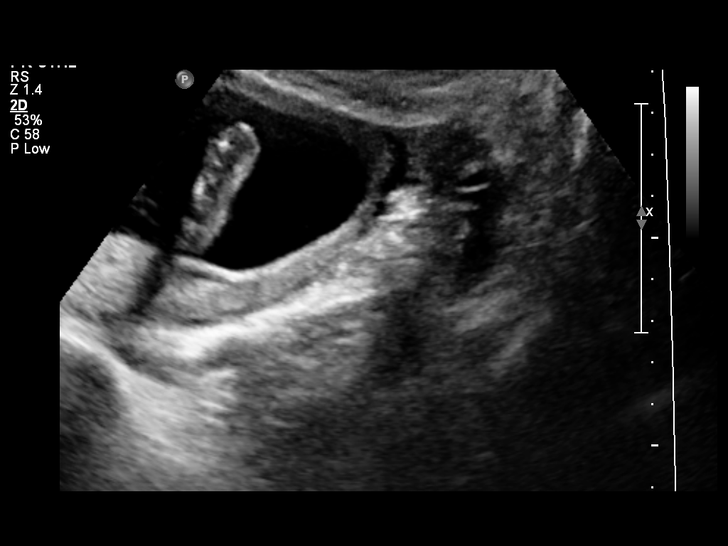
[im 19/82]
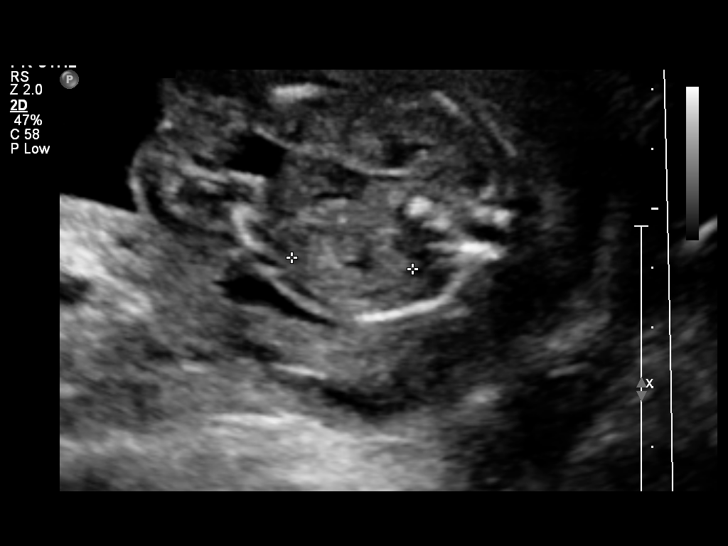
[im 26/82]
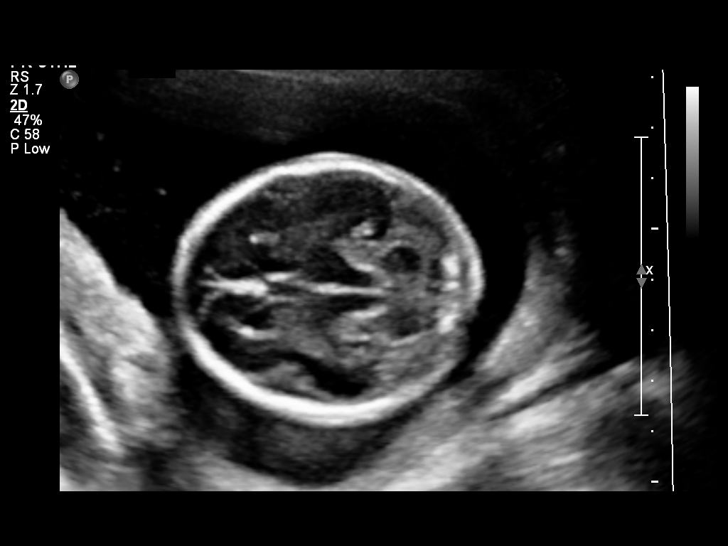
[im 37/82]
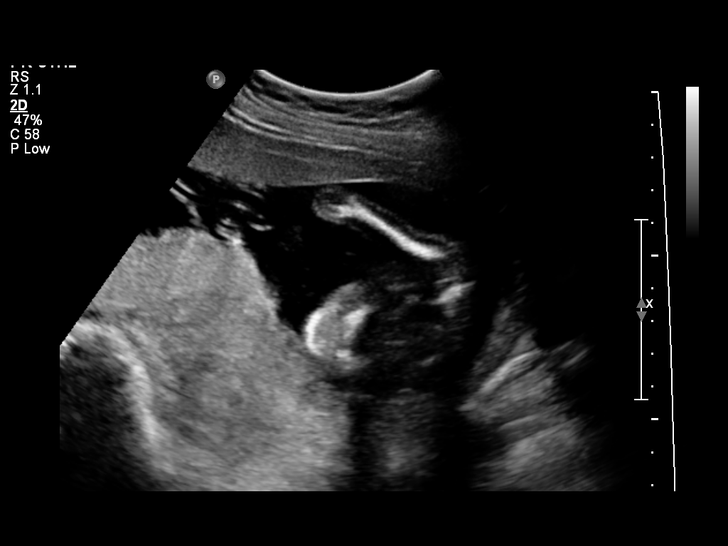
[im 45/82]
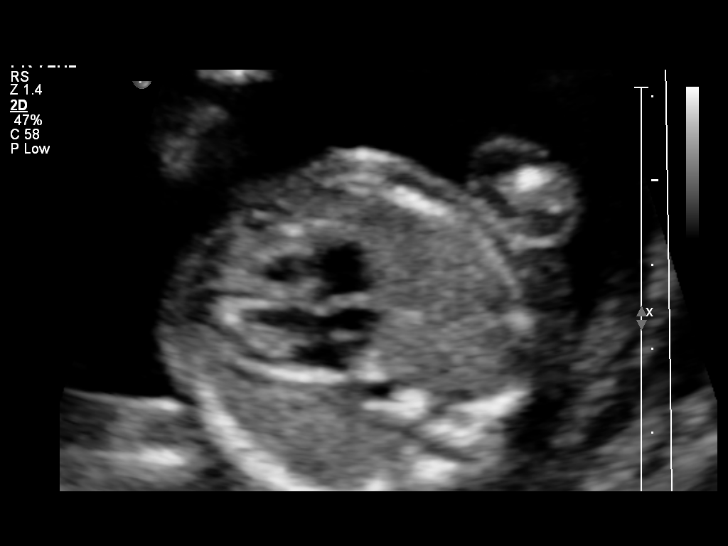
[im 52/82]
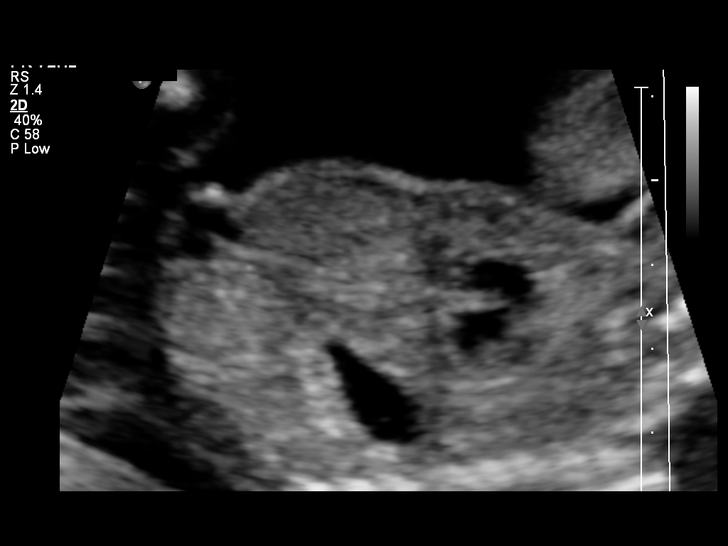
[im 63/82]
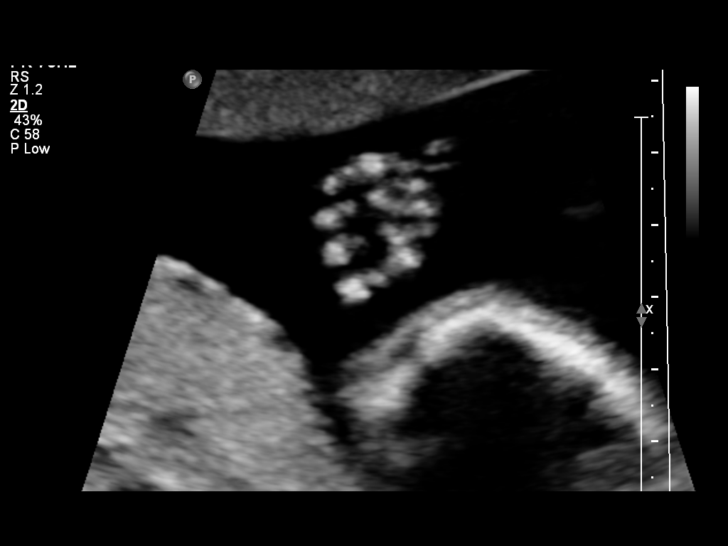
[im 70/82]
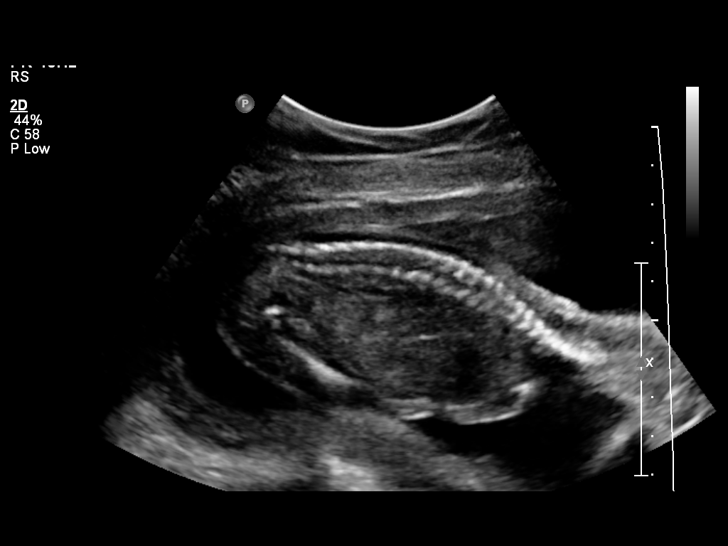
[im 78/82]
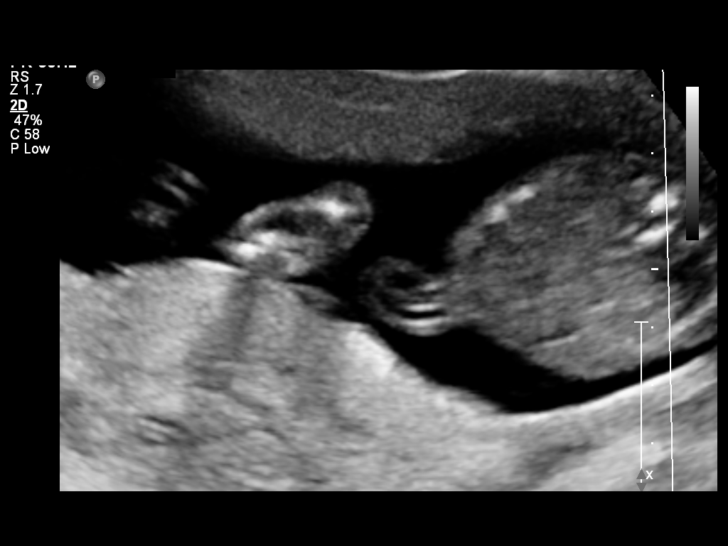

[12 of 28 positions shown; findings below may reference images not displayed]

OBSTETRICS REPORT
                    (Corrected Final 01/28/2012 [DATE])

Service(s) Provided

 US OB DETAIL + 14 WK                                  76811.0
Indications

 Detailed fetal anatomic survey
Fetal Evaluation

 Num Of Fetuses:    1
 Fetal Heart Rate:  149                         bpm
 Cardiac Activity:  Observed
 Presentation:      Cephalic
 Placenta:          Posterior Fundal, above
                    cervical os
 P. Cord            Visualized
 Insertion:

 Amniotic Fluid
 AFI FV:      Subjectively within normal limits
                                             Larg Pckt:     5.0  cm
Biometry

 BPD:     48.5  mm    G. Age:   20w 5d                CI:        76.49   70 - 86
                                                      FL/HC:      19.2   16.1 -

 HC:     175.7  mm    G. Age:   20w 0d       73  %    HC/AC:      1.15   1.09 -

 AC:     152.8  mm    G. Age:   20w 3d       78  %    FL/BPD:
 FL:      33.8  mm    G. Age:   20w 4d       81  %    FL/AC:      22.1   20 - 24
 HUM:     30.1  mm    G. Age:   20w 0d       64  %
 CER:     21.9  mm    G. Age:   20w 6d       77  %
 NFT:     4.08  mm

 Est. FW:     358  gm    0 lb 13 oz      62  %
Gestational Age

 U/S Today:     20w 3d                                        EDD:   06/11/12
 Best:          19w 3d    Det. By:   Early Ultrasound         EDD:   06/18/12
Anatomy

 Cranium:          Appears normal         Aortic Arch:      Appears normal
 Fetal Cavum:      Appears normal         Ductal Arch:      Appears normal
 Ventricles:       Appears normal         Diaphragm:        Appears normal
 Choroid Plexus:   Appears normal         Stomach:          Appears normal
 Cerebellum:       Appears normal         Abdomen:          Appears normal
 Posterior Fossa:  Appears normal         Abdominal Wall:   Appears nml (cord
                                                            insert, abd wall)
 Nuchal Fold:      Appears normal         Cord Vessels:     Appears normal (3
                   (neck, nuchal fold)                      vessel cord)
 Face:             Appears normal         Kidneys:          Appear normal
                   (lips/profile/orbits)
 Heart:            Appears normal (4      Bladder:          Appears normal
                   chamber & axis)
 RVOT:             Appears normal         Spine:            Appears normal
 LVOT:             Appears normal         Limbs:            Appears normal
                                                            (hands, ankles, feet)

 Other:  Parents do not wish to know sex of fetus. Heels and 5th digit
         visualized.  Nasal bone visualized.
Targeted Anatomy

 Fetal Central Nervous System
 Cisterna Magna:
Cervix Uterus Adnexa

 Cervical Length:   5.05      cm

 Cervix:       Normal appearance by transabdominal scan.
 Left Ovary:   Not visualized.
 Right Ovary:  Within normal limits.

 Adnexa:     No abnormality visualized.
Impression

 Assigned GA is currently 19w 3d.   [facility] US EGA on
 today's exam.
 No fetal anomalies seen involving visualized anatomy.
 No sonographic markers for aneuploidy visualized.

 questions or concerns.

## 2012-01-28 ENCOUNTER — Encounter: Payer: Self-pay | Admitting: Diagnostic Radiology

## 2012-02-06 ENCOUNTER — Ambulatory Visit (INDEPENDENT_AMBULATORY_CARE_PROVIDER_SITE_OTHER): Payer: Commercial Indemnity | Admitting: Obstetrics and Gynecology

## 2012-02-06 ENCOUNTER — Encounter: Payer: Self-pay | Admitting: Obstetrics and Gynecology

## 2012-02-06 DIAGNOSIS — Z3480 Encounter for supervision of other normal pregnancy, unspecified trimester: Secondary | ICD-10-CM

## 2012-02-06 NOTE — Progress Notes (Signed)
Occasional H/A's stress related, not treating. Plans WaterBirth class. Flu shot today.

## 2012-02-06 NOTE — Patient Instructions (Signed)
Pregnancy - Second Trimester The second trimester of pregnancy (3 to 6 months) is a period of rapid growth for you and your baby. At the end of the sixth month, your baby is about 9 inches long and weighs 1 1/2 pounds. You will begin to feel the baby move between 18 and 20 weeks of the pregnancy. This is called quickening. Weight gain is faster. A clear fluid (colostrum) may leak out of your breasts. You may feel small contractions of the womb (uterus). This is known as false labor or Braxton-Hicks contractions. This is like a practice for labor when the baby is ready to be born. Usually, the problems with morning sickness have usually passed by the end of your first trimester. Some women develop small dark blotches (called cholasma, mask of pregnancy) on their face that usually goes away after the baby is born. Exposure to the sun makes the blotches worse. Acne may also develop in some pregnant women and pregnant women who have acne, may find that it goes away. PRENATAL EXAMS  Blood work may continue to be done during prenatal exams. These tests are done to check on your health and the probable health of your baby. Blood work is used to follow your blood levels (hemoglobin). Anemia (low hemoglobin) is common during pregnancy. Iron and vitamins are given to help prevent this. You will also be checked for diabetes between 24 and 28 weeks of the pregnancy. Some of the previous blood tests may be repeated.   The size of the uterus is measured during each visit. This is to make sure that the baby is continuing to grow properly according to the dates of the pregnancy.   Your blood pressure is checked every prenatal visit. This is to make sure you are not getting toxemia.   Your urine is checked to make sure you do not have an infection, diabetes or protein in the urine.   Your weight is checked often to make sure gains are happening at the suggested rate. This is to ensure that both you and your baby are  growing normally.   Sometimes, an ultrasound is performed to confirm the proper growth and development of the baby. This is a test which bounces harmless sound waves off the baby so your caregiver can more accurately determine due dates.  Sometimes, a specialized test is done on the amniotic fluid surrounding the baby. This test is called an amniocentesis. The amniotic fluid is obtained by sticking a needle into the belly (abdomen). This is done to check the chromosomes in instances where there is a concern about possible genetic problems with the baby. It is also sometimes done near the end of pregnancy if an early delivery is required. In this case, it is done to help make sure the baby's lungs are mature enough for the baby to live outside of the womb. CHANGES OCCURING IN THE SECOND TRIMESTER OF PREGNANCY Your body goes through many changes during pregnancy. They vary from person to person. Talk to your caregiver about changes you notice that you are concerned about.  During the second trimester, you will likely have an increase in your appetite. It is normal to have cravings for certain foods. This varies from person to person and pregnancy to pregnancy.   Your lower abdomen will begin to bulge.   You may have to urinate more often because the uterus and baby are pressing on your bladder. It is also common to get more bladder infections during pregnancy (  pain with urination). You can help this by drinking lots of fluids and emptying your bladder before and after intercourse.   You may begin to get stretch marks on your hips, abdomen, and breasts. These are normal changes in the body during pregnancy. There are no exercises or medications to take that prevent this change.   You may begin to develop swollen and bulging veins (varicose veins) in your legs. Wearing support hose, elevating your feet for 15 minutes, 3 to 4 times a day and limiting salt in your diet helps lessen the problem.    Heartburn may develop as the uterus grows and pushes up against the stomach. Antacids recommended by your caregiver helps with this problem. Also, eating smaller meals 4 to 5 times a day helps.   Constipation can be treated with a stool softener or adding bulk to your diet. Drinking lots of fluids, vegetables, fruits, and whole grains are helpful.   Exercising is also helpful. If you have been very active up until your pregnancy, most of these activities can be continued during your pregnancy. If you have been less active, it is helpful to start an exercise program such as walking.   Hemorrhoids (varicose veins in the rectum) may develop at the end of the second trimester. Warm sitz baths and hemorrhoid cream recommended by your caregiver helps hemorrhoid problems.   Backaches may develop during this time of your pregnancy. Avoid heavy lifting, wear low heal shoes and practice good posture to help with backache problems.   Some pregnant women develop tingling and numbness of their hand and fingers because of swelling and tightening of ligaments in the wrist (carpel tunnel syndrome). This goes away after the baby is born.   As your breasts enlarge, you may have to get a bigger bra. Get a comfortable, cotton, support bra. Do not get a nursing bra until the last month of the pregnancy if you will be nursing the baby.   You may get a dark line from your belly button to the pubic area called the linea nigra.   You may develop rosy cheeks because of increase blood flow to the face.   You may develop spider looking lines of the face, neck, arms and chest. These go away after the baby is born.  HOME CARE INSTRUCTIONS   It is extremely important to avoid all smoking, herbs, alcohol, and unprescribed drugs during your pregnancy. These chemicals affect the formation and growth of the baby. Avoid these chemicals throughout the pregnancy to ensure the delivery of a healthy infant.   Most of your home  care instructions are the same as suggested for the first trimester of your pregnancy. Keep your caregiver's appointments. Follow your caregiver's instructions regarding medication use, exercise and diet.   During pregnancy, you are providing food for you and your baby. Continue to eat regular, well-balanced meals. Choose foods such as meat, fish, milk and other low fat dairy products, vegetables, fruits, and whole-grain breads and cereals. Your caregiver will tell you of the ideal weight gain.   A physical sexual relationship may be continued up until near the end of pregnancy if there are no other problems. Problems could include early (premature) leaking of amniotic fluid from the membranes, vaginal bleeding, abdominal pain, or other medical or pregnancy problems.   Exercise regularly if there are no restrictions. Check with your caregiver if you are unsure of the safety of some of your exercises. The greatest weight gain will occur in the   last 2 trimesters of pregnancy. Exercise will help you:   Control your weight.   Get you in shape for labor and delivery.   Lose weight after you have the baby.   Wear a good support or jogging bra for breast tenderness during pregnancy. This may help if worn during sleep. Pads or tissues may be used in the bra if you are leaking colostrum.   Do not use hot tubs, steam rooms or saunas throughout the pregnancy.   Wear your seat belt at all times when driving. This protects you and your baby if you are in an accident.   Avoid raw meat, uncooked cheese, cat litter boxes and soil used by cats. These carry germs that can cause birth defects in the baby.   The second trimester is also a good time to visit your dentist for your dental health if this has not been done yet. Getting your teeth cleaned is OK. Use a soft toothbrush. Brush gently during pregnancy.   It is easier to loose urine during pregnancy. Tightening up and strengthening the pelvic muscles will  help with this problem. Practice stopping your urination while you are going to the bathroom. These are the same muscles you need to strengthen. It is also the muscles you would use as if you were trying to stop from passing gas. You can practice tightening these muscles up 10 times a set and repeating this about 3 times per day. Once you know what muscles to tighten up, do not perform these exercises during urination. It is more likely to contribute to an infection by backing up the urine.   Ask for help if you have financial, counseling or nutritional needs during pregnancy. Your caregiver will be able to offer counseling for these needs as well as refer you for other special needs.   Your skin may become oily. If so, wash your face with mild soap, use non-greasy moisturizer and oil or cream based makeup.  MEDICATIONS AND DRUG USE IN PREGNANCY  Take prenatal vitamins as directed. The vitamin should contain 1 milligram of folic acid. Keep all vitamins out of reach of children. Only a couple vitamins or tablets containing iron may be fatal to a baby or young child when ingested.   Avoid use of all medications, including herbs, over-the-counter medications, not prescribed or suggested by your caregiver. Only take over-the-counter or prescription medicines for pain, discomfort, or fever as directed by your caregiver. Do not use aspirin.   Let your caregiver also know about herbs you may be using.   Alcohol is related to a number of birth defects. This includes fetal alcohol syndrome. All alcohol, in any form, should be avoided completely. Smoking will cause low birth rate and premature babies.   Street or illegal drugs are very harmful to the baby. They are absolutely forbidden. A baby born to an addicted mother will be addicted at birth. The baby will go through the same withdrawal an adult does.  SEEK MEDICAL CARE IF:  You have any concerns or worries during your pregnancy. It is better to call with  your questions if you feel they cannot wait, rather than worry about them. SEEK IMMEDIATE MEDICAL CARE IF:   An unexplained oral temperature above 102 F (38.9 C) develops, or as your caregiver suggests.   You have leaking of fluid from the vagina (birth canal). If leaking membranes are suspected, take your temperature and tell your caregiver of this when you call.   There   is vaginal spotting, bleeding, or passing clots. Tell your caregiver of the amount and how many pads are used. Light spotting in pregnancy is common, especially following intercourse.   You develop a bad smelling vaginal discharge with a change in the color from clear to white.   You continue to feel sick to your stomach (nauseated) and have no relief from remedies suggested. You vomit blood or coffee ground-like materials.   You lose more than 2 pounds of weight or gain more than 2 pounds of weight over 1 week, or as suggested by your caregiver.   You notice swelling of your face, hands, feet, or legs.   You get exposed to German measles and have never had them.   You are exposed to fifth disease or chickenpox.   You develop belly (abdominal) pain. Round ligament discomfort is a common non-cancerous (benign) cause of abdominal pain in pregnancy. Your caregiver still must evaluate you.   You develop a bad headache that does not go away.   You develop fever, diarrhea, pain with urination, or shortness of breath.   You develop visual problems, blurry, or double vision.   You fall or are in a car accident or any kind of trauma.   There is mental or physical violence at home.  Document Released: 04/22/2001 Document Revised: 04/17/2011 Document Reviewed: 10/25/2008 [employer] Patient Information 2012 [employer]. 

## 2012-02-06 NOTE — Progress Notes (Signed)
p-73  She wants a Flu Vaccine today

## 2012-02-20 ENCOUNTER — Ambulatory Visit (INDEPENDENT_AMBULATORY_CARE_PROVIDER_SITE_OTHER): Payer: Commercial Indemnity | Admitting: Family

## 2012-02-20 VITALS — BP 114/75 | Temp 97.7°F | Wt 166.0 lb

## 2012-02-20 DIAGNOSIS — Z3480 Encounter for supervision of other normal pregnancy, unspecified trimester: Secondary | ICD-10-CM

## 2012-02-20 DIAGNOSIS — N63 Unspecified lump in unspecified breast: Secondary | ICD-10-CM

## 2012-02-20 DIAGNOSIS — Z3490 Encounter for supervision of normal pregnancy, unspecified, unspecified trimester: Secondary | ICD-10-CM

## 2012-02-20 DIAGNOSIS — N6310 Unspecified lump in the right breast, unspecified quadrant: Secondary | ICD-10-CM

## 2012-02-20 NOTE — Progress Notes (Signed)
p=76 

## 2012-02-20 NOTE — Progress Notes (Signed)
Low pubic bone; pt desires to do jelly bean challenge (27 Brachs); would really want to know sex (desires girl), but keeping a surprise; leg cramps>magnesium

## 2012-03-19 ENCOUNTER — Ambulatory Visit (INDEPENDENT_AMBULATORY_CARE_PROVIDER_SITE_OTHER): Payer: Commercial Indemnity | Admitting: Family

## 2012-03-19 VITALS — BP 120/71 | Temp 97.0°F | Wt 172.0 lb

## 2012-03-19 DIAGNOSIS — Z3490 Encounter for supervision of normal pregnancy, unspecified, unspecified trimester: Secondary | ICD-10-CM

## 2012-03-19 DIAGNOSIS — O26899 Other specified pregnancy related conditions, unspecified trimester: Secondary | ICD-10-CM

## 2012-03-19 DIAGNOSIS — Z6791 Unspecified blood type, Rh negative: Secondary | ICD-10-CM | POA: Insufficient documentation

## 2012-03-19 DIAGNOSIS — Z3480 Encounter for supervision of other normal pregnancy, unspecified trimester: Secondary | ICD-10-CM

## 2012-03-19 DIAGNOSIS — O360990 Maternal care for other rhesus isoimmunization, unspecified trimester, not applicable or unspecified: Secondary | ICD-10-CM

## 2012-03-19 LAB — CBC
HCT: 36.9 % (ref 36.0–46.0)
MCH: 28.4 pg (ref 26.0–34.0)
MCHC: 33.3 g/dL (ref 30.0–36.0)
MCV: 85.2 fL (ref 78.0–100.0)
RDW: 13.8 % (ref 11.5–15.5)
WBC: 9.2 10*3/uL (ref 4.0–10.5)

## 2012-03-19 NOTE — Progress Notes (Signed)
p-72  C/O lots of vaginal pressure and lower back pain.  28 week labs today and will do Rhogam next week.

## 2012-03-19 NOTE — Progress Notes (Signed)
Difficulty sleeping in past week; groin pain; may try Melatonin or Valeria; May encourage husband to give nightly massages.  1 hr today w/labs

## 2012-03-20 LAB — ANTIBODY SCREEN: Antibody Screen: NEGATIVE

## 2012-03-21 ENCOUNTER — Encounter: Payer: Self-pay | Admitting: Family

## 2012-03-22 ENCOUNTER — Telehealth: Payer: Self-pay | Admitting: *Deleted

## 2012-03-22 NOTE — Telephone Encounter (Signed)
LM on voicemail that she passed her 1 hr GTT @ 83.

## 2012-04-02 ENCOUNTER — Ambulatory Visit (INDEPENDENT_AMBULATORY_CARE_PROVIDER_SITE_OTHER): Payer: Commercial Indemnity | Admitting: Advanced Practice Midwife

## 2012-04-02 VITALS — BP 118/76 | Temp 97.1°F | Wt 175.0 lb

## 2012-04-02 DIAGNOSIS — Z3490 Encounter for supervision of normal pregnancy, unspecified, unspecified trimester: Secondary | ICD-10-CM

## 2012-04-02 DIAGNOSIS — O9270 Unspecified disorders of lactation: Secondary | ICD-10-CM

## 2012-04-02 DIAGNOSIS — Z3480 Encounter for supervision of other normal pregnancy, unspecified trimester: Secondary | ICD-10-CM

## 2012-04-02 DIAGNOSIS — O360990 Maternal care for other rhesus isoimmunization, unspecified trimester, not applicable or unspecified: Secondary | ICD-10-CM

## 2012-04-02 DIAGNOSIS — O26899 Other specified pregnancy related conditions, unspecified trimester: Secondary | ICD-10-CM

## 2012-04-02 MED ORDER — RHO D IMMUNE GLOBULIN 1500 UNIT/2ML IJ SOLN
300.0000 ug | Freq: Once | INTRAMUSCULAR | Status: AC
  Administered 2012-04-02: 300 ug via INTRAMUSCULAR

## 2012-04-02 NOTE — Patient Instructions (Signed)
[employer]

## 2012-04-02 NOTE — Progress Notes (Signed)
p-94 

## 2012-04-02 NOTE — Progress Notes (Signed)
Rhophylac today. Reviewed labs. Signed up for waterbirth class.

## 2012-04-16 ENCOUNTER — Ambulatory Visit (INDEPENDENT_AMBULATORY_CARE_PROVIDER_SITE_OTHER): Payer: Commercial Indemnity | Admitting: Advanced Practice Midwife

## 2012-04-16 VITALS — BP 109/61 | Temp 98.2°F | Wt 175.0 lb

## 2012-04-16 DIAGNOSIS — Z3490 Encounter for supervision of normal pregnancy, unspecified, unspecified trimester: Secondary | ICD-10-CM

## 2012-04-16 DIAGNOSIS — Z3480 Encounter for supervision of other normal pregnancy, unspecified trimester: Secondary | ICD-10-CM

## 2012-04-16 NOTE — Patient Instructions (Signed)

## 2012-04-16 NOTE — Progress Notes (Signed)
Rash x 6 days. Started groin, spread to full body, now face and neck only. No new soaps, detergents foods, medications. Trying elimination of potential allergens. Recommend Benadryl at night, Claritin in day, Cortisone cream.

## 2012-04-16 NOTE — Progress Notes (Signed)
p-87  Rash on face and body.  Hip pain.

## 2012-04-30 ENCOUNTER — Ambulatory Visit (INDEPENDENT_AMBULATORY_CARE_PROVIDER_SITE_OTHER): Payer: Managed Care, Other (non HMO) | Admitting: Advanced Practice Midwife

## 2012-04-30 ENCOUNTER — Encounter: Payer: Self-pay | Admitting: Advanced Practice Midwife

## 2012-04-30 VITALS — BP 117/73 | Wt 177.0 lb

## 2012-04-30 DIAGNOSIS — Z3480 Encounter for supervision of other normal pregnancy, unspecified trimester: Secondary | ICD-10-CM

## 2012-04-30 NOTE — Progress Notes (Signed)
p=76 

## 2012-04-30 NOTE — Patient Instructions (Signed)
Pregnancy - Third Trimester  The third trimester of pregnancy (the last 3 months) is a period of the most rapid growth for you and your baby. The baby approaches a length of 20 inches and a weight of 6 to 10 pounds. The baby is adding on fat and getting ready for life outside your body. While inside, babies have periods of sleeping and waking, suck their thumbs, and hiccups. You can often feel small contractions of the uterus. This is false labor. It is also called Braxton-Hicks contractions. This is like a practice for labor. The usual problems in this stage of pregnancy include more difficulty breathing, swelling of the hands and feet from water retention, and having to urinate more often because of the uterus and baby pressing on your bladder.   PRENATAL EXAMS  · Blood work may continue to be done during prenatal exams. These tests are done to check on your health and the probable health of your baby. Blood work is used to follow your blood levels (hemoglobin). Anemia (low hemoglobin) is common during pregnancy. Iron and vitamins are given to help prevent this. You may also continue to be checked for diabetes. Some of the past blood tests may be done again.  · The size of the uterus is measured during each visit. This makes sure your baby is growing properly according to your pregnancy dates.  · Your blood pressure is checked every prenatal visit. This is to make sure you are not getting toxemia.  · Your urine is checked every prenatal visit for infection, diabetes and protein.  · Your weight is checked at each visit. This is done to make sure gains are happening at the suggested rate and that you and your baby are growing normally.  · Sometimes, an ultrasound is performed to confirm the position and the proper growth and development of the baby. This is a test done that bounces harmless sound waves off the baby so your caregiver can more accurately determine due dates.  · Discuss the type of pain medication and  anesthesia you will have during your labor and delivery.  · Discuss the possibility and anesthesia if a Cesarean Section might be necessary.  · Inform your caregiver if there is any mental or physical violence at home.  Sometimes, a specialized non-stress test, contraction stress test and biophysical profile are done to make sure the baby is not having a problem. Checking the amniotic fluid surrounding the baby is called an amniocentesis. The amniotic fluid is removed by sticking a needle into the belly (abdomen). This is sometimes done near the end of pregnancy if an early delivery is required. In this case, it is done to help make sure the baby's lungs are mature enough for the baby to live outside of the womb. If the lungs are not mature and it is unsafe to deliver the baby, an injection of cortisone medication is given to the mother 1 to 2 days before the delivery. This helps the baby's lungs mature and makes it safer to deliver the baby.  CHANGES OCCURING IN THE THIRD TRIMESTER OF PREGNANCY  Your body goes through many changes during pregnancy. They vary from person to person. Talk to your caregiver about changes you notice and are concerned about.  · During the last trimester, you have probably had an increase in your appetite. It is normal to have cravings for certain foods. This varies from person to person and pregnancy to pregnancy.  · You may begin to   get stretch marks on your hips, abdomen, and breasts. These are normal changes in the body during pregnancy. There are no exercises or medications to take which prevent this change.  · Constipation may be treated with a stool softener or adding bulk to your diet. Drinking lots of fluids, fiber in vegetables, fruits, and whole grains are helpful.  · Exercising is also helpful. If you have been very active up until your pregnancy, most of these activities can be continued during your pregnancy. If you have been less active, it is helpful to start an exercise  program such as walking. Consult your caregiver before starting exercise programs.  · Avoid all smoking, alcohol, un-prescribed drugs, herbs and "street drugs" during your pregnancy. These chemicals affect the formation and growth of the baby. Avoid chemicals throughout the pregnancy to ensure the delivery of a healthy infant.  · Backache, varicose veins and hemorrhoids may develop or get worse.  · You will tire more easily in the third trimester, which is normal.  · The baby's movements may be stronger and more often.  · You may become short of breath easily.  · Your belly button may stick out.  · A yellow discharge may leak from your breasts called colostrum.  · You may have a bloody mucus discharge. This usually occurs a few days to a week before labor begins.  HOME CARE INSTRUCTIONS   · Keep your caregiver's appointments. Follow your caregiver's instructions regarding medication use, exercise, and diet.  · During pregnancy, you are providing food for you and your baby. Continue to eat regular, well-balanced meals. Choose foods such as meat, fish, milk and other low fat dairy products, vegetables, fruits, and whole-grain breads and cereals. Your caregiver will tell you of the ideal weight gain.  · A physical sexual relationship may be continued throughout pregnancy if there are no other problems such as early (premature) leaking of amniotic fluid from the membranes, vaginal bleeding, or belly (abdominal) pain.  · Exercise regularly if there are no restrictions. Check with your caregiver if you are unsure of the safety of your exercises. Greater weight gain will occur in the last 2 trimesters of pregnancy. Exercising helps:  · Control your weight.  · Get you in shape for labor and delivery.  · You lose weight after you deliver.  · Rest a lot with legs elevated, or as needed for leg cramps or low back pain.  · Wear a good support or jogging bra for breast tenderness during pregnancy. This may help if worn during  sleep. Pads or tissues may be used in the bra if you are leaking colostrum.  · Do not use hot tubs, steam rooms, or saunas.  · Wear your seat belt when driving. This protects you and your baby if you are in an accident.  · Avoid raw meat, cat litter boxes and soil used by cats. These carry germs that can cause birth defects in the baby.  · It is easier to loose urine during pregnancy. Tightening up and strengthening the pelvic muscles will help with this problem. You can practice stopping your urination while you are going to the bathroom. These are the same muscles you need to strengthen. It is also the muscles you would use if you were trying to stop from passing gas. You can practice tightening these muscles up 10 times a set and repeating this about 3 times per day. Once you know what muscles to tighten up, do not perform these   exercises during urination. It is more likely to cause an infection by backing up the urine.  · Ask for help if you have financial, counseling or nutritional needs during pregnancy. Your caregiver will be able to offer counseling for these needs as well as refer you for other special needs.  · Make a list of emergency phone numbers and have them available.  · Plan on getting help from family or friends when you go home from the hospital.  · Make a trial run to the hospital.  · Take prenatal classes with the father to understand, practice and ask questions about the labor and delivery.  · Prepare the baby's room/nursery.  · Do not travel out of the city unless it is absolutely necessary and with the advice of your caregiver.  · Wear only low or no heal shoes to have better balance and prevent falling.  MEDICATIONS AND DRUG USE IN PREGNANCY  · Take prenatal vitamins as directed. The vitamin should contain 1 milligram of folic acid. Keep all vitamins out of reach of children. Only a couple vitamins or tablets containing iron may be fatal to a baby or young child when ingested.  · Avoid use  of all medications, including herbs, over-the-counter medications, not prescribed or suggested by your caregiver. Only take over-the-counter or prescription medicines for pain, discomfort, or fever as directed by your caregiver. Do not use aspirin, ibuprofen (Motrin®, Advil®, Nuprin®) or naproxen (Aleve®) unless OK'd by your caregiver.  · Let your caregiver also know about herbs you may be using.  · Alcohol is related to a number of birth defects. This includes fetal alcohol syndrome. All alcohol, in any form, should be avoided completely. Smoking will cause low birth rate and premature babies.  · Street/illegal drugs are very harmful to the baby. They are absolutely forbidden. A baby born to an addicted mother will be addicted at birth. The baby will go through the same withdrawal an adult does.  SEEK MEDICAL CARE IF:  You have any concerns or worries during your pregnancy. It is better to call with your questions if you feel they cannot wait, rather than worry about them.  DECISIONS ABOUT CIRCUMCISION  You may or may not know the sex of your baby. If you know your baby is a boy, it may be time to think about circumcision. Circumcision is the removal of the foreskin of the penis. This is the skin that covers the sensitive end of the penis. There is no proven medical need for this. Often this decision is made on what is popular at the time or based upon religious beliefs and social issues. You can discuss these issues with your caregiver or pediatrician.  SEEK IMMEDIATE MEDICAL CARE IF:   · An unexplained oral temperature above 102° F (38.9° C) develops, or as your caregiver suggests.  · You have leaking of fluid from the vagina (birth canal). If leaking membranes are suspected, take your temperature and tell your caregiver of this when you call.  · There is vaginal spotting, bleeding or passing clots. Tell your caregiver of the amount and how many pads are used.  · You develop a bad smelling vaginal discharge with  a change in the color from clear to white.  · You develop vomiting that lasts more than 24 hours.  · You develop chills or fever.  · You develop shortness of breath.  · You develop burning on urination.  · You loose more than 2 pounds of weight   or gain more than 2 pounds of weight or as suggested by your caregiver.  · You notice sudden swelling of your face, hands, and feet or legs.  · You develop belly (abdominal) pain. Round ligament discomfort is a common non-cancerous (benign) cause of abdominal pain in pregnancy. Your caregiver still must evaluate you.  · You develop a severe headache that does not go away.  · You develop visual problems, blurred or double vision.  · If you have not felt your baby move for more than 1 hour. If you think the baby is not moving as much as usual, eat something with sugar in it and lie down on your left side for an hour. The baby should move at least 4 to 5 times per hour. Call right away if your baby moves less than that.  · You fall, are in a car accident or any kind of trauma.  · There is mental or physical violence at home.  Document Released: 04/22/2001 Document Revised: 07/21/2011 Document Reviewed: 10/25/2008  [employer]® Patient Information ©2013 [employer].

## 2012-04-30 NOTE — Progress Notes (Signed)
C/o generalized pregnancy discomfort, but no regular ctx or concerning pains. Discussed waterbirth, took class, still considering it. Rev'd precautions.

## 2012-05-14 ENCOUNTER — Ambulatory Visit (INDEPENDENT_AMBULATORY_CARE_PROVIDER_SITE_OTHER): Payer: Managed Care, Other (non HMO) | Admitting: Advanced Practice Midwife

## 2012-05-14 VITALS — BP 106/66 | Wt 179.0 lb

## 2012-05-14 DIAGNOSIS — O479 False labor, unspecified: Secondary | ICD-10-CM

## 2012-05-14 DIAGNOSIS — Z3490 Encounter for supervision of normal pregnancy, unspecified, unspecified trimester: Secondary | ICD-10-CM

## 2012-05-14 DIAGNOSIS — Z3480 Encounter for supervision of other normal pregnancy, unspecified trimester: Secondary | ICD-10-CM

## 2012-05-14 NOTE — Progress Notes (Signed)
Increased UC's past week, resolved w/ rest. Declines VE today. PTL precautions.

## 2012-05-14 NOTE — Progress Notes (Signed)
p-[id number]   Increase in contractions this week.

## 2012-05-14 NOTE — Patient Instructions (Addendum)
Fetal Movement Counts Patient Name: __________________________________________________ Patient Due Date: ____________________ Kick counts is highly recommended in high risk pregnancies, but it is a good idea for every pregnant woman to do. Start counting fetal movements at 28 weeks of the pregnancy. Fetal movements increase after eating a full meal or eating or drinking something sweet (the blood sugar is higher). It is also important to drink plenty of fluids (well hydrated) before doing the count. Lie on your left side because it helps with the circulation or you can sit in a comfortable chair with your arms over your belly (abdomen) with no distractions around you. DOING THE COUNT  Try to do the count the same time of day each time you do it.  Mark the day and time, then see how long it takes for you to feel 10 movements (kicks, flutters, swishes, rolls). You should have at least 10 movements within 2 hours. You will most likely feel 10 movements in much less than 2 hours. If you do not, wait an hour and count again. After a couple of days you will see a pattern.  What you are looking for is a change in the pattern or not enough counts in 2 hours. Is it taking longer in time to reach 10 movements? SEEK MEDICAL CARE IF:  You feel less than 10 counts in 2 hours. Tried twice.  No movement in one hour.  The pattern is changing or taking longer each day to reach 10 counts in 2 hours.  You feel the baby is not moving as it usually does. Date: ____________ Movements: ____________ Start time: ____________ Finish time: ____________  Date: ____________ Movements: ____________ Start time: ____________ Finish time: ____________ Date: ____________ Movements: ____________ Start time: ____________ Finish time: ____________ Date: ____________ Movements: ____________ Start time: ____________ Finish time: ____________ Date: ____________ Movements: ____________ Start time: ____________ Finish time:  ____________ Date: ____________ Movements: ____________ Start time: ____________ Finish time: ____________ Date: ____________ Movements: ____________ Start time: ____________ Finish time: ____________ Date: ____________ Movements: ____________ Start time: ____________ Finish time: ____________  Date: ____________ Movements: ____________ Start time: ____________ Finish time: ____________ Date: ____________ Movements: ____________ Start time: ____________ Finish time: ____________ Date: ____________ Movements: ____________ Start time: ____________ Finish time: ____________ Date: ____________ Movements: ____________ Start time: ____________ Finish time: ____________ Date: ____________ Movements: ____________ Start time: ____________ Finish time: ____________ Date: ____________ Movements: ____________ Start time: ____________ Finish time: ____________ Date: ____________ Movements: ____________ Start time: ____________ Finish time: ____________  Date: ____________ Movements: ____________ Start time: ____________ Finish time: ____________ Date: ____________ Movements: ____________ Start time: ____________ Finish time: ____________ Date: ____________ Movements: ____________ Start time: ____________ Finish time: ____________ Date: ____________ Movements: ____________ Start time: ____________ Finish time: ____________ Date: ____________ Movements: ____________ Start time: ____________ Finish time: ____________ Date: ____________ Movements: ____________ Start time: ____________ Finish time: ____________ Date: ____________ Movements: ____________ Start time: ____________ Finish time: ____________  Date: ____________ Movements: ____________ Start time: ____________ Finish time: ____________ Date: ____________ Movements: ____________ Start time: ____________ Finish time: ____________ Date: ____________ Movements: ____________ Start time: ____________ Finish time: ____________ Date: ____________ Movements:  ____________ Start time: ____________ Finish time: ____________ Date: ____________ Movements: ____________ Start time: ____________ Finish time: ____________ Date: ____________ Movements: ____________ Start time: ____________ Finish time: ____________ Date: ____________ Movements: ____________ Start time: ____________ Finish time: ____________  Date: ____________ Movements: ____________ Start time: ____________ Finish time: ____________ Date: ____________ Movements: ____________ Start time: ____________ Finish time: ____________ Date: ____________ Movements: ____________ Start time: ____________ Finish time: ____________ Date: ____________ Movements:   ____________ Start time: ____________ Finish time: ____________ Date: ____________ Movements: ____________ Start time: ____________ Finish time: ____________ Date: ____________ Movements: ____________ Start time: ____________ Finish time: ____________ Date: ____________ Movements: ____________ Start time: ____________ Finish time: ____________  Date: ____________ Movements: ____________ Start time: ____________ Finish time: ____________ Date: ____________ Movements: ____________ Start time: ____________ Finish time: ____________ Date: ____________ Movements: ____________ Start time: ____________ Finish time: ____________ Date: ____________ Movements: ____________ Start time: ____________ Finish time: ____________ Date: ____________ Movements: ____________ Start time: ____________ Finish time: ____________ Date: ____________ Movements: ____________ Start time: ____________ Finish time: ____________ Date: ____________ Movements: ____________ Start time: ____________ Finish time: ____________  Date: ____________ Movements: ____________ Start time: ____________ Finish time: ____________ Date: ____________ Movements: ____________ Start time: ____________ Finish time: ____________ Date: ____________ Movements: ____________ Start time: ____________ Finish  time: ____________ Date: ____________ Movements: ____________ Start time: ____________ Finish time: ____________ Date: ____________ Movements: ____________ Start time: ____________ Finish time: ____________ Date: ____________ Movements: ____________ Start time: ____________ Finish time: ____________ Date: ____________ Movements: ____________ Start time: ____________ Finish time: ____________  Date: ____________ Movements: ____________ Start time: ____________ Finish time: ____________ Date: ____________ Movements: ____________ Start time: ____________ Finish time: ____________ Date: ____________ Movements: ____________ Start time: ____________ Finish time: ____________ Date: ____________ Movements: ____________ Start time: ____________ Finish time: ____________ Date: ____________ Movements: ____________ Start time: ____________ Finish time: ____________ Date: ____________ Movements: ____________ Start time: ____________ Finish time: ____________ Document Released: 05/28/2006 Document Revised: 07/21/2011 Document Reviewed: 11/28/2008 [employer] Patient Information 2013 [employer].  Braxton Hicks Contractions Pregnancy is commonly associated with contractions of the uterus throughout the pregnancy. Towards the end of pregnancy (32 to 34 weeks), these contractions (Braxton Hicks) can develop more often and may become more forceful. This is not true labor because these contractions do not result in opening (dilatation) and thinning of the cervix. They are sometimes difficult to tell apart from true labor because these contractions can be forceful and people have different pain tolerances. You should not feel embarrassed if you go to the hospital with false labor. Sometimes, the only way to tell if you are in true labor is for your caregiver to follow the changes in the cervix. How to tell the difference between true and false labor:  False labor.  The contractions of false labor are usually  shorter, irregular and not as hard as those of true labor.  They are often felt in the front of the lower abdomen and in the groin.  They may leave with walking around or changing positions while lying down.  They get weaker and are shorter lasting as time goes on.  These contractions are usually irregular.  They do not usually become progressively stronger, regular and closer together as with true labor.  True labor.  Contractions in true labor last 30 to 70 seconds, become very regular, usually become more intense, and increase in frequency.  They do not go away with walking.  The discomfort is usually felt in the top of the uterus and spreads to the lower abdomen and low back.  True labor can be determined by your caregiver with an exam. This will show that the cervix is dilating and getting thinner. If there are no prenatal problems or other health problems associated with the pregnancy, it is completely safe to be sent home with false labor and await the onset of true labor. HOME CARE INSTRUCTIONS   Keep up with your usual exercises and instructions.  Take medications as directed.  Keep your regular prenatal appointment.    Eat and drink lightly if you think you are going into labor.  If BH contractions are making you uncomfortable:  Change your activity position from lying down or resting to walking/walking to resting.  Sit and rest in a tub of warm water.  Drink 2 to 3 glasses of water. Dehydration may cause B-H contractions.  Do slow and deep breathing several times an hour. SEEK IMMEDIATE MEDICAL CARE IF:   Your contractions continue to become stronger, more regular, and closer together.  You have a gushing, burst or leaking of fluid from the vagina.  An oral temperature above 102 F (38.9 C) develops.  You have passage of blood-tinged mucus.  You develop vaginal bleeding.  You develop continuous belly (abdominal) pain.  You have low back pain that you  never had before.  You feel the baby's head pushing down causing pelvic pressure.  The baby is not moving as much as it used to. Document Released: 04/28/2005 Document Revised: 07/21/2011 Document Reviewed: 10/20/2008 [employer] Patient Information 2013 [employer].  

## 2012-05-21 ENCOUNTER — Ambulatory Visit (INDEPENDENT_AMBULATORY_CARE_PROVIDER_SITE_OTHER): Payer: Managed Care, Other (non HMO) | Admitting: Family

## 2012-05-21 ENCOUNTER — Other Ambulatory Visit: Payer: Self-pay | Admitting: Family

## 2012-05-21 VITALS — BP 109/65 | Wt 181.0 lb

## 2012-05-21 DIAGNOSIS — Z3480 Encounter for supervision of other normal pregnancy, unspecified trimester: Secondary | ICD-10-CM

## 2012-05-21 DIAGNOSIS — Z3490 Encounter for supervision of normal pregnancy, unspecified, unspecified trimester: Secondary | ICD-10-CM

## 2012-05-21 LAB — OB RESULTS CONSOLE GBS: GBS: NEGATIVE

## 2012-05-21 NOTE — Progress Notes (Signed)
p-82   36 week cultures today

## 2012-05-21 NOTE — Progress Notes (Signed)
Reports irregular contractions last week; no bleeding or leaking of fluid.  GBS, GC/CT today.

## 2012-05-21 NOTE — Addendum Note (Signed)
Addended by: Granville Lewis on: 05/21/2012 09:53 AM   Modules accepted: Orders

## 2012-05-22 LAB — GC/CHLAMYDIA PROBE AMP: GC Probe RNA: NEGATIVE

## 2012-05-26 ENCOUNTER — Encounter: Payer: Self-pay | Admitting: Family

## 2012-05-28 ENCOUNTER — Ambulatory Visit (INDEPENDENT_AMBULATORY_CARE_PROVIDER_SITE_OTHER): Payer: Managed Care, Other (non HMO) | Admitting: Advanced Practice Midwife

## 2012-05-28 VITALS — BP 117/67 | Wt 183.0 lb

## 2012-05-28 DIAGNOSIS — Z3490 Encounter for supervision of normal pregnancy, unspecified, unspecified trimester: Secondary | ICD-10-CM

## 2012-05-28 DIAGNOSIS — Z3480 Encounter for supervision of other normal pregnancy, unspecified trimester: Secondary | ICD-10-CM

## 2012-05-28 NOTE — Progress Notes (Signed)
No concerns. Neg GBS.

## 2012-05-28 NOTE — Patient Instructions (Signed)
Normal Labor and Delivery  Your caregiver must first be sure you are in labor. Signs of labor include:  · You may pass what is called "the mucus plug" before labor begins. This is a small amount of blood stained mucus.  · Regular uterine contractions.  · The time between contractions get closer together.  · The discomfort and pain gradually gets more intense.  · Pains are mostly located in the back.  · Pains get worse when walking.  · The cervix (the opening of the uterus becomes thinner (begins to efface) and opens up (dilates).  Once you are in labor and admitted into the hospital or care center, your caregiver will do the following:  · A complete physical examination.  · Check your vital signs (blood pressure, pulse, temperature and the fetal heart rate).  · Do a vaginal examination (using a sterile glove and lubricant) to determine:  · The position (presentation) of the baby (head [vertex] or buttock first).  · The level (station) of the baby's head in the birth canal.  · The effacement and dilatation of the cervix.  · You may have your pubic hair shaved and be given an enema depending on your caregiver and the circumstance.  · An electronic monitor is usually placed on your abdomen. The monitor follows the length and intensity of the contractions, as well as the baby's heart rate.  · Usually, your caregiver will insert an IV in your arm with a bottle of sugar water. This is done as a precaution so that medications can be given to you quickly during labor or delivery.  NORMAL LABOR AND DELIVERY IS DIVIDED UP INTO 3 STAGES:  First Stage  This is when regular contractions begin and the cervix begins to efface and dilate. This stage can last from 3 to 15 hours. The end of the first stage is when the cervix is 100% effaced and 10 centimeters dilated. Pain medications may be given by   · Injection (morphine, demerol, etc.)  · Regional anesthesia (spinal, caudal or epidural, anesthetics given in different locations of  the spine). Paracervical pain medication may be given, which is an injection of and anesthetic on each side of the cervix.  A pregnant woman may request to have "Natural Childbirth" which is not to have any medications or anesthesia during her labor and delivery.  Second Stage  This is when the baby comes down through the birth canal (vagina) and is born. This can take 1 to 4 hours. As the baby's head comes down through the birth canal, you may feel like you are going to have a bowel movement. You will get the urge to bear down and push until the baby is delivered. As the baby's head is being delivered, the caregiver will decide if an episiotomy (a cut in the perineum and vagina area) is needed to prevent tearing of the tissue in this area. The episiotomy is sewn up after the delivery of the baby and placenta. Sometimes a mask with nitrous oxide is given for the mother to breath during the delivery of the baby to help if there is too much pain. The end of Stage 2 is when the baby is fully delivered. Then when the umbilical cord stops pulsating it is clamped and cut.  Third Stage  The third stage begins after the baby is completely delivered and ends after the placenta (afterbirth) is delivered. This usually takes 5 to 30 minutes. After the placenta is delivered, a medication   is given either by intravenous or injection to help contract the uterus and prevent bleeding. The third stage is not painful and pain medication is usually not necessary. If an episiotomy was done, it is repaired at this time.  After the delivery, the mother is watched and monitored closely for 1 to 2 hours to make sure there is no postpartum bleeding (hemorrhage). If there is a lot of bleeding, medication is given to contract the uterus and stop the bleeding.  Document Released: 02/05/2008 Document Revised: 07/21/2011 Document Reviewed: 02/05/2008  Exit[employer]® Patient Information ©2013 [employer].

## 2012-05-28 NOTE — Progress Notes (Signed)
p78 

## 2012-06-04 ENCOUNTER — Ambulatory Visit (INDEPENDENT_AMBULATORY_CARE_PROVIDER_SITE_OTHER): Payer: Managed Care, Other (non HMO) | Admitting: Family

## 2012-06-04 VITALS — BP 112/73 | Wt 182.0 lb

## 2012-06-04 DIAGNOSIS — Z3480 Encounter for supervision of other normal pregnancy, unspecified trimester: Secondary | ICD-10-CM

## 2012-06-04 DIAGNOSIS — Z3490 Encounter for supervision of normal pregnancy, unspecified, unspecified trimester: Secondary | ICD-10-CM

## 2012-06-04 NOTE — Progress Notes (Signed)
P-74 - Pt has felt more cramps than usual

## 2012-06-04 NOTE — Progress Notes (Signed)
Pt has not questions or concerns; reviewed signs of labor; cervix membranes swept.

## 2012-06-11 ENCOUNTER — Ambulatory Visit (INDEPENDENT_AMBULATORY_CARE_PROVIDER_SITE_OTHER): Payer: Managed Care, Other (non HMO) | Admitting: Advanced Practice Midwife

## 2012-06-11 VITALS — BP 127/76 | Wt 184.0 lb

## 2012-06-11 DIAGNOSIS — Z3490 Encounter for supervision of normal pregnancy, unspecified, unspecified trimester: Secondary | ICD-10-CM

## 2012-06-11 DIAGNOSIS — Z3480 Encounter for supervision of other normal pregnancy, unspecified trimester: Secondary | ICD-10-CM

## 2012-06-11 NOTE — Progress Notes (Signed)
p-95  Would like cervix checked

## 2012-06-11 NOTE — Patient Instructions (Signed)
Normal Labor and Delivery  Your caregiver must first be sure you are in labor. Signs of labor include:  · You may pass what is called "the mucus plug" before labor begins. This is a small amount of blood stained mucus.  · Regular uterine contractions.  · The time between contractions get closer together.  · The discomfort and pain gradually gets more intense.  · Pains are mostly located in the back.  · Pains get worse when walking.  · The cervix (the opening of the uterus becomes thinner (begins to efface) and opens up (dilates).  Once you are in labor and admitted into the hospital or care center, your caregiver will do the following:  · A complete physical examination.  · Check your vital signs (blood pressure, pulse, temperature and the fetal heart rate).  · Do a vaginal examination (using a sterile glove and lubricant) to determine:  · The position (presentation) of the baby (head [vertex] or buttock first).  · The level (station) of the baby's head in the birth canal.  · The effacement and dilatation of the cervix.  · You may have your pubic hair shaved and be given an enema depending on your caregiver and the circumstance.  · An electronic monitor is usually placed on your abdomen. The monitor follows the length and intensity of the contractions, as well as the baby's heart rate.  · Usually, your caregiver will insert an IV in your arm with a bottle of sugar water. This is done as a precaution so that medications can be given to you quickly during labor or delivery.  NORMAL LABOR AND DELIVERY IS DIVIDED UP INTO 3 STAGES:  First Stage  This is when regular contractions begin and the cervix begins to efface and dilate. This stage can last from 3 to 15 hours. The end of the first stage is when the cervix is 100% effaced and 10 centimeters dilated. Pain medications may be given by   · Injection (morphine, demerol, etc.)  · Regional anesthesia (spinal, caudal or epidural, anesthetics given in different locations of  the spine). Paracervical pain medication may be given, which is an injection of and anesthetic on each side of the cervix.  A pregnant woman may request to have "Natural Childbirth" which is not to have any medications or anesthesia during her labor and delivery.  Second Stage  This is when the baby comes down through the birth canal (vagina) and is born. This can take 1 to 4 hours. As the baby's head comes down through the birth canal, you may feel like you are going to have a bowel movement. You will get the urge to bear down and push until the baby is delivered. As the baby's head is being delivered, the caregiver will decide if an episiotomy (a cut in the perineum and vagina area) is needed to prevent tearing of the tissue in this area. The episiotomy is sewn up after the delivery of the baby and placenta. Sometimes a mask with nitrous oxide is given for the mother to breath during the delivery of the baby to help if there is too much pain. The end of Stage 2 is when the baby is fully delivered. Then when the umbilical cord stops pulsating it is clamped and cut.  Third Stage  The third stage begins after the baby is completely delivered and ends after the placenta (afterbirth) is delivered. This usually takes 5 to 30 minutes. After the placenta is delivered, a medication   is given either by intravenous or injection to help contract the uterus and prevent bleeding. The third stage is not painful and pain medication is usually not necessary. If an episiotomy was done, it is repaired at this time.  After the delivery, the mother is watched and monitored closely for 1 to 2 hours to make sure there is no postpartum bleeding (hemorrhage). If there is a lot of bleeding, medication is given to contract the uterus and stop the bleeding.  Document Released: 02/05/2008 Document Revised: 07/21/2011 Document Reviewed: 02/05/2008  Exit[employer]® Patient Information ©2013 [employer].

## 2012-06-11 NOTE — Progress Notes (Signed)
Membranes swept. Ready for baby!

## 2012-06-18 ENCOUNTER — Ambulatory Visit (INDEPENDENT_AMBULATORY_CARE_PROVIDER_SITE_OTHER): Payer: Managed Care, Other (non HMO) | Admitting: Advanced Practice Midwife

## 2012-06-18 VITALS — BP 128/80 | Wt 185.0 lb

## 2012-06-18 DIAGNOSIS — Z3480 Encounter for supervision of other normal pregnancy, unspecified trimester: Secondary | ICD-10-CM

## 2012-06-18 DIAGNOSIS — O99891 Other specified diseases and conditions complicating pregnancy: Secondary | ICD-10-CM

## 2012-06-18 NOTE — Progress Notes (Signed)
p-99  Due date today!!

## 2012-06-18 NOTE — Progress Notes (Signed)
Doing well.  Good fetal movement, denies vaginal bleeding, LOF, regular contractions.  Very anxious to have baby. No regular ctx. Membranes swept today, cervix very posterior. Discussed IOL at 41 weeks.  Pt to call office next week if no signs of labor to discuss scheduling induction.

## 2012-06-20 ENCOUNTER — Encounter (HOSPITAL_COMMUNITY): Payer: Self-pay | Admitting: *Deleted

## 2012-06-20 ENCOUNTER — Inpatient Hospital Stay (HOSPITAL_COMMUNITY)
Admission: AD | Admit: 2012-06-20 | Discharge: 2012-06-21 | DRG: 775 | Disposition: A | Discharge status: Home / Self Care | Payer: Managed Care, Other (non HMO) | Source: Ambulatory Visit | Attending: Obstetrics & Gynecology | Admitting: Obstetrics & Gynecology

## 2012-06-20 DIAGNOSIS — N6310 Unspecified lump in the right breast, unspecified quadrant: Secondary | ICD-10-CM

## 2012-06-20 DIAGNOSIS — O4290 Premature rupture of membranes, unspecified as to length of time between rupture and onset of labor, unspecified weeks of gestation: Secondary | ICD-10-CM

## 2012-06-20 DIAGNOSIS — Z6791 Unspecified blood type, Rh negative: Secondary | ICD-10-CM

## 2012-06-20 DIAGNOSIS — Z3490 Encounter for supervision of normal pregnancy, unspecified, unspecified trimester: Secondary | ICD-10-CM

## 2012-06-20 LAB — CBC
HCT: 37.3 % (ref 36.0–46.0)
Hemoglobin: 12.3 g/dL (ref 12.0–15.0)
MCH: 27.7 pg (ref 26.0–34.0)
MCV: 84 fL (ref 78.0–100.0)
RBC: 4.44 MIL/uL (ref 3.87–5.11)

## 2012-06-20 MED ORDER — ACETAMINOPHEN 325 MG PO TABS: 650.0000 mg | ORAL_TABLET | ORAL | Status: DC | PRN

## 2012-06-20 MED ORDER — ONDANSETRON HCL 4 MG/2ML IJ SOLN: 4.0000 mg | INTRAMUSCULAR | Status: DC | PRN

## 2012-06-20 MED ORDER — OXYTOCIN 40 UNITS IN LACTATED RINGERS INFUSION - SIMPLE MED
62.5000 mL/h | INTRAVENOUS | Status: DC
  Filled 2012-06-20: qty 1000

## 2012-06-20 MED ORDER — ZOLPIDEM TARTRATE 5 MG PO TABS: 5.0000 mg | ORAL_TABLET | Freq: Every evening | ORAL | Status: DC | PRN

## 2012-06-20 MED ORDER — OXYCODONE-ACETAMINOPHEN 5-325 MG PO TABS: 1.0000 | ORAL_TABLET | ORAL | Status: DC | PRN

## 2012-06-20 MED ORDER — OXYTOCIN BOLUS FROM INFUSION: 500.0000 mL | INTRAVENOUS | Status: DC

## 2012-06-20 MED ORDER — DIPHENHYDRAMINE HCL 25 MG PO CAPS: 25.0000 mg | ORAL_CAPSULE | Freq: Four times a day (QID) | ORAL | Status: DC | PRN

## 2012-06-20 MED ORDER — LACTATED RINGERS IV SOLN
INTRAVENOUS | Status: DC
  Administered 2012-06-20: 06:00:00 via INTRAVENOUS

## 2012-06-20 MED ORDER — PRENATAL MULTIVITAMIN CH
1.0000 | ORAL_TABLET | Freq: Every day | ORAL | Status: DC
  Administered 2012-06-21: 1 via ORAL
  Filled 2012-06-20: qty 1

## 2012-06-20 MED ORDER — SENNOSIDES-DOCUSATE SODIUM 8.6-50 MG PO TABS
2.0000 | ORAL_TABLET | Freq: Every day | ORAL | Status: DC
  Administered 2012-06-20: 2 via ORAL

## 2012-06-20 MED ORDER — LIDOCAINE HCL (PF) 1 % IJ SOLN
30.0000 mL | INTRAMUSCULAR | Status: DC | PRN
  Filled 2012-06-20: qty 30

## 2012-06-20 MED ORDER — BENZOCAINE-MENTHOL 20-0.5 % EX AERO
1.0000 "application " | INHALATION_SPRAY | CUTANEOUS | Status: DC | PRN
  Administered 2012-06-20: 1 via TOPICAL
  Filled 2012-06-20: qty 56

## 2012-06-20 MED ORDER — TETANUS-DIPHTH-ACELL PERTUSSIS 5-2.5-18.5 LF-MCG/0.5 IM SUSP
0.5000 mL | Freq: Once | INTRAMUSCULAR | Status: AC
  Administered 2012-06-21: 0.5 mL via INTRAMUSCULAR
  Filled 2012-06-20: qty 0.5

## 2012-06-20 MED ORDER — CITRIC ACID-SODIUM CITRATE 334-500 MG/5ML PO SOLN: 30.0000 mL | ORAL | Status: DC | PRN

## 2012-06-20 MED ORDER — IBUPROFEN 600 MG PO TABS
600.0000 mg | ORAL_TABLET | Freq: Four times a day (QID) | ORAL | Status: DC | PRN
  Administered 2012-06-20: 600 mg via ORAL
  Filled 2012-06-20: qty 1

## 2012-06-20 MED ORDER — SIMETHICONE 80 MG PO CHEW: 80.0000 mg | CHEWABLE_TABLET | ORAL | Status: DC | PRN

## 2012-06-20 MED ORDER — DIBUCAINE 1 % RE OINT: 1.0000 "application " | TOPICAL_OINTMENT | RECTAL | Status: DC | PRN

## 2012-06-20 MED ORDER — IBUPROFEN 600 MG PO TABS
600.0000 mg | ORAL_TABLET | Freq: Four times a day (QID) | ORAL | Status: DC
  Administered 2012-06-20 – 2012-06-21 (×4): 600 mg via ORAL
  Filled 2012-06-20 (×4): qty 1

## 2012-06-20 MED ORDER — ONDANSETRON HCL 4 MG/2ML IJ SOLN: 4.0000 mg | Freq: Four times a day (QID) | INTRAMUSCULAR | Status: DC | PRN

## 2012-06-20 MED ORDER — ONDANSETRON HCL 4 MG PO TABS: 4.0000 mg | ORAL_TABLET | ORAL | Status: DC | PRN

## 2012-06-20 MED ORDER — WITCH HAZEL-GLYCERIN EX PADS: 1.0000 "application " | MEDICATED_PAD | CUTANEOUS | Status: DC | PRN

## 2012-06-20 MED ORDER — LANOLIN HYDROUS EX OINT: TOPICAL_OINTMENT | CUTANEOUS | Status: DC | PRN

## 2012-06-20 MED ORDER — LACTATED RINGERS IV SOLN
500.0000 mL | INTRAVENOUS | Status: DC | PRN
  Administered 2012-06-20: 500 mL via INTRAVENOUS

## 2012-06-20 NOTE — MAU Note (Signed)
Donato Schultz ,CNM in and checked pt-SVE-6cm and SROM since 0430-light greenish colored meconium-FHT 145-150

## 2012-06-20 NOTE — MAU Note (Signed)
My water broke at 0430. I'm contracting.

## 2012-06-20 NOTE — H&P (Signed)
Deborah Edwards is a 32 y.o. female presenting for contractions and rupture of membranes at 0400.  Received prenatal care at [facility] clinic.   Maternal Medical History:  Reason for admission: Rupture of membranes and contractions.   Contractions: Onset was 3-5 hours ago.   Frequency: regular.    Fetal activity: Perceived fetal activity is normal.   Last perceived fetal movement was within the past hour.      OB History   Grav Para Term Preterm Abortions TAB SAB Ect Mult Living   4 3 3       3      Past Medical History  Diagnosis Date  . Irregular heart beat   . Abnormal Pap smear     colpo ok; 2011   Past Surgical History  Procedure Laterality Date  . Wisdom tooth extraction     Family History: family history includes Diabetes in her maternal grandmother and Heart failure in her father.  There is no history of Cancer. Social History:  reports that she quit smoking about 10 years ago. Her smoking use included Cigarettes. She has a 1.5 pack-year smoking history. She does not have any smokeless tobacco history on file. She reports that  drinks alcohol. She reports that she does not use illicit drugs.   Prenatal Transfer Tool  Maternal Diabetes: No Genetic Screening: Declined Maternal Ultrasounds/Referrals: Normal Fetal Ultrasounds or other Referrals:  None Maternal Substance Abuse:  No Significant Maternal Medications:  None Significant Maternal Lab Results:  Lab values include: Group B Strep negative Other Comments:  None  Review of Systems  Gastrointestinal: Positive for abdominal pain (contractions).  Genitourinary:       Rupture of membranes  All other systems reviewed and are negative.    Dilation: 5.5 Effacement (%): 90 Station: -2 Exam by:: Roney Marion, CNM Blood pressure 131/85, pulse 83, temperature 98.1 F (36.7 C), temperature source Oral, resp. rate 20, height 5\' 9"  (1.753 m), weight 85.186 kg (187 lb 12.8 oz), last menstrual period  08/17/2011. Maternal Exam:  Abdomen: Estimated fetal weight is 7-7.5lb.   Fetal presentation: vertex  Introitus: Vagina is positive for vaginal discharge (mucusy).    Physical Exam  Constitutional: She is oriented to person, place, and time. She appears well-developed and well-nourished. No distress.  HENT:  Head: Normocephalic.  Neck: Normal range of motion. Neck supple.  Cardiovascular: Normal rate, regular rhythm and normal heart sounds.   Respiratory: Effort normal and breath sounds normal.  GI: Soft. There is no tenderness.  Genitourinary: No bleeding around the vagina. Vaginal discharge (mucusy) found.  Pooling - positive  Musculoskeletal: Normal range of motion. She exhibits no edema.  Neurological: She is alert and oriented to person, place, and time.  Skin: Skin is warm and dry.    Prenatal labs: ABO, Rh: AB/NEG/-- (06/14 1246) Antibody: NEG (11/08 0936) Rubella: 77.3 (06/14 1246) RPR: NON REAC (11/08 0936)  HBsAg: NEGATIVE (06/14 1246)  HIV: NON REACTIVE (11/08 0936)  GBS:     Assessment/Plan: Active Labor Rupture of Membranes GBS negative  Plan: Admit to birthing suites Anticipate NSVD   [facility] 06/20/2012, 6:12 AM

## 2012-06-20 NOTE — H&P (Signed)
Attestation of Attending Supervision of Advanced Practitioner (CNM/NP): Evaluation and management procedures were performed by the Advanced Practitioner under my supervision and collaboration.  I have reviewed the Advanced Practitioner's note and chart, and I agree with the management and plan.  HARRAWAY-SMITH, [place] Staffa 9:15 AM     

## 2012-06-21 LAB — CBC
MCHC: 33 g/dL (ref 30.0–36.0)
Platelets: 307 10*3/uL (ref 150–400)
RDW: 14.8 % (ref 11.5–15.5)
WBC: 15.1 10*3/uL — ABNORMAL HIGH (ref 4.0–10.5)

## 2012-06-21 NOTE — Discharge Planning (Cosign Needed)
Obstetric Discharge Summary Reason for Admission: onset of labor Prenatal Procedures: none Intrapartum Procedures: spontaneous vaginal delivery Postpartum Procedures: none Complications-Operative and Postpartum: none  Hemoglobin  Date Value Range Status  06/21/2012 11.6* 12.0 - 15.0 g/dL Final     HCT  Date Value Range Status  06/21/2012 35.1* 36.0 - 46.0 % Final    Physical Exam:  General: alert, cooperative and no distress Lochia: appropriate Uterine Fundus: firm Incision: n/a DVT Evaluation: No evidence of DVT seen on physical exam. No cords or calf tenderness. No significant calf/ankle edema.  Discharge Diagnoses: Term Pregnancy-delivered  Discharge Information: Date: 06/21/2012 Activity: unrestricted Diet: routine Medications: None Condition: stable Instructions: refer to practice specific booklet Discharge to: home   Newborn Data: Live born female  Birth Weight: 8 lb 8 oz (3856 g) APGAR: 9, 9  Home with mother.  Adela Glimpse 06/21/2012, 7:30 AM

## 2012-06-21 NOTE — Discharge Summary (Signed)
Obstetric Discharge Summary Reason for Admission: onset of labor Prenatal Procedures: none Intrapartum Procedures: spontaneous vaginal delivery Postpartum Procedures: none Complications-Operative and Postpartum: none Hemoglobin  Date Value Range Status  06/21/2012 11.6* 12.0 - 15.0 g/dL Final     HCT  Date Value Range Status  06/21/2012 35.1* 36.0 - 46.0 % Final    Physical Exam:  General: alert, cooperative and no distress Lochia: appropriate Uterine Fundus: firm Incision: n/a DVT Evaluation: No evidence of DVT seen on physical exam. No cords or calf tenderness. No significant calf/ankle edema.  Discharge Diagnoses: Term Pregnancy-delivered  Discharge Information: Date: 06/21/2012 Activity: unrestricted Diet: routine Medications: None Condition: stable Instructions: refer to practice specific booklet Discharge to: home   Newborn Data: Live born female  Birth Weight: 8 lb 8 oz (3856 g) APGAR: 9, 9  Home with mother.   Deborah Edwards 06/21/2012, 7:57 AM  I have seen the patient with the resident/student and agree with the above.  [name]

## 2012-06-22 NOTE — Discharge Summary (Signed)
Attestation of Attending Supervision of Advanced Practitioner (CNM/NP): Evaluation and management procedures were performed by the Advanced Practitioner under my supervision and collaboration.  I have reviewed the Advanced Practitioner's note and chart, and I agree with the management and plan.  Kimberleigh Mehan 06/22/2012 2:03 PM

## 2012-06-28 ENCOUNTER — Telehealth (HOSPITAL_COMMUNITY): Payer: Self-pay | Admitting: *Deleted

## 2012-06-28 NOTE — Telephone Encounter (Signed)
Resolve episode 

## 2012-08-04 ENCOUNTER — Encounter: Payer: Self-pay | Admitting: Obstetrics & Gynecology

## 2012-08-04 ENCOUNTER — Ambulatory Visit (INDEPENDENT_AMBULATORY_CARE_PROVIDER_SITE_OTHER): Payer: Managed Care, Other (non HMO) | Admitting: Obstetrics & Gynecology

## 2012-08-04 MED ORDER — NORETHINDRONE 0.35 MG PO TABS: 1.0000 | ORAL_TABLET | Freq: Every day | ORAL | Status: DC

## 2012-08-04 NOTE — Progress Notes (Signed)
  Subjective:    Patient ID: Uriyah Massimo, female    DOB: 25-Sep-1980, 32 y.o.   MRN: [id number]  HPI  Breeze is a MW P4 6 weeks s/p NSVD with no tears. She is here for her PP visit. She wants to delay pregnancy for at least a year. She wants to restart OCPs, but ones which will not affect the amount of her milk supply. She has not had sex yet. Her son is growing well. She denies pp blues. She reports normal bowel and bladder function.  Review of Systems Pap/HPV negative 6/13    Objective:   Physical Exam  She declines an exam.      Assessment & Plan:  Post partum- doing great Contraception- Micronor (80% effectiveness rate explained to [facility]). She will switch to combination OCPs when she has weaned the baby. RTC 1 year/prn sooner

## 2013-02-14 ENCOUNTER — Encounter: Payer: Self-pay | Admitting: Diagnostic Radiology

## 2013-02-14 NOTE — Telephone Encounter (Signed)
This encounter was created in error - please disregard.

## 2013-05-12 NOTE — L&D Delivery Note (Signed)
Delivery Note Pushed well in variety of positions. Fetus felt OP to me. At 6:32 AM a viable and healthy female was delivered via Vaginal, Spontaneous Delivery (Presentation: Middle Occiput Posterior).  APGAR: , ; weight  .   No difficulty with shoulders.  Placenta status: Spontaneous and grossly intact with 3 vessel cord. with the following complications: None.   Anesthesia: None  Episiotomy:  none Lacerations:  none Suture Repair: none Est. Blood Loss (mL):  200  Mom to postpartum.  Baby to Couplet care / Skin to Skin.  [name] 03/17/2014, 7:00 AM

## 2013-06-20 ENCOUNTER — Ambulatory Visit (INDEPENDENT_AMBULATORY_CARE_PROVIDER_SITE_OTHER): Payer: Managed Care, Other (non HMO) | Admitting: Advanced Practice Midwife

## 2013-06-20 ENCOUNTER — Encounter: Payer: Self-pay | Admitting: Advanced Practice Midwife

## 2013-06-20 VITALS — BP 106/67 | HR 65 | Resp 16 | Ht 69.0 in | Wt 147.0 lb

## 2013-06-20 DIAGNOSIS — Z124 Encounter for screening for malignant neoplasm of cervix: Secondary | ICD-10-CM

## 2013-06-20 DIAGNOSIS — Z1151 Encounter for screening for human papillomavirus (HPV): Secondary | ICD-10-CM

## 2013-06-20 DIAGNOSIS — Z01419 Encounter for gynecological examination (general) (routine) without abnormal findings: Secondary | ICD-10-CM

## 2013-06-20 DIAGNOSIS — Z30011 Encounter for initial prescription of contraceptive pills: Secondary | ICD-10-CM

## 2013-06-20 DIAGNOSIS — Z3009 Encounter for other general counseling and advice on contraception: Secondary | ICD-10-CM

## 2013-06-20 MED ORDER — NORETHINDRONE-ETH ESTRADIOL 0.5-35 MG-MCG PO TABS: 1.0000 | ORAL_TABLET | Freq: Every day | ORAL | Status: DC

## 2013-06-20 NOTE — Progress Notes (Signed)
   Subjective:    Patient ID: Deborah Edwards, female    DOB: Feb 08, 1981, 33 y.o.   MRN: [id number]  HPI    Review of Systems     Objective:   Physical Exam        Assessment & Plan:   Subjective:     Deborah Edwards is a 33 y.o. female and is here for a annual gynecology exam and initiation of OCPs. The patient reports no problems. She is no longer breastfeeding. She is having normal 28-day menstrual cycles. Patient's last menstrual period was 06/13/2013. No intermenstrual bleeding, vaginal discharge, dyspareunia or breast lumps. She is currently sexually active.   History   Social History  . Marital Status: Married    Spouse Name: N/A    Number of Children: N/A  . Years of Education: N/A   Occupational History  . homemaker    Social History Main Topics  . Smoking status: Former Smoker -- 0.30 packs/day for 5 years    Types: Cigarettes    Quit date: 10/23/2001  . Smokeless tobacco: Not on file  . Alcohol Use: Yes     Comment: wine on occassion  . Drug Use: No  . Sexual Activity: Yes    Partners: Male   Other Topics Concern  . Not on file   Social History Narrative  . No narrative on file   Health Maintenance  Topic Date Due  . Influenza Vaccine  12/10/2012  . Pap Smear  10/24/2014  . Tetanus/tdap  06/21/2022    The following portions of the patient's history were reviewed and updated as appropriate: allergies, current medications, past family history, past medical history, past social history, past surgical history and problem list.  Review of Systems Pertinent items are noted in HPI.   Objective:    BP 106/67  Pulse 65  Resp 16  Ht 5\' 9"  (1.753 m)  Wt 147 lb (66.679 kg)  BMI 21.70 kg/m2  LMP 06/13/2013  Breastfeeding? No General appearance: alert, cooperative, appears stated age and no distress Head: Normocephalic, without obvious abnormality, atraumatic Neck: no adenopathy and thyroid not enlarged, symmetric, no  tenderness/mass/nodules Back: No CVAT> Lungs: clear to auscultation bilaterally Breasts: normal appearance, no masses or tenderness, No nipple retraction or dimpling, No nipple discharge or bleeding, No axillary or supraclavicular adenopathy, Taught monthly breast self examination Heart: regular rate and rhythm, S1, S2 normal, no murmur, click, rub or gallop Abdomen: soft, non-tender; bowel sounds normal; no masses,  no organomegaly Pelvic: cervix normal in appearance, external genitalia normal, no adnexal masses or tenderness, no cervical motion tenderness, uterus normal size, shape, and consistency and vagina normal without discharge Extremities: extremities normal, atraumatic, no cyanosis or edema and Homans sign is negative, no sign of DVT   Assessment:  Normal female exam.    Encounter for cervical Pap smear with pelvic exam - Plan: Cytology - PAP w/ co-testing  OCP (oral contraceptive pills) initiation - Plan: norethindrone-ethinyl estradiol (NECON 0.5/35, 28,) 0.5-35 MG-MCG tablet   Plan:     See After Visit Summary for Counseling Recommendations  Taught SBE. F/U in 1 year. Discussed current Pap recommendations for Pap w/ co-testing in 5 years.   [place], CNM 06/20/2013 1:10 PM

## 2013-06-20 NOTE — Patient Instructions (Signed)

## 2013-06-23 ENCOUNTER — Encounter: Payer: Self-pay | Admitting: Advanced Practice Midwife

## 2013-06-23 ENCOUNTER — Telehealth: Payer: Self-pay | Admitting: *Deleted

## 2013-06-23 DIAGNOSIS — R87810 Cervical high risk human papillomavirus (HPV) DNA test positive: Secondary | ICD-10-CM | POA: Insufficient documentation

## 2013-06-23 NOTE — Telephone Encounter (Signed)
Fax sent to [facility] for add-on HPV to pap smear done 06/20/13.

## 2013-06-29 ENCOUNTER — Encounter: Payer: Self-pay | Admitting: Advanced Practice Midwife

## 2013-06-30 ENCOUNTER — Encounter: Payer: Self-pay | Admitting: *Deleted

## 2013-08-08 ENCOUNTER — Encounter: Payer: Self-pay | Admitting: Advanced Practice Midwife

## 2013-08-08 ENCOUNTER — Ambulatory Visit (INDEPENDENT_AMBULATORY_CARE_PROVIDER_SITE_OTHER): Payer: Managed Care, Other (non HMO) | Admitting: Advanced Practice Midwife

## 2013-08-08 VITALS — BP 104/64 | Wt 150.0 lb

## 2013-08-08 DIAGNOSIS — Z3480 Encounter for supervision of other normal pregnancy, unspecified trimester: Secondary | ICD-10-CM

## 2013-08-08 DIAGNOSIS — O219 Vomiting of pregnancy, unspecified: Secondary | ICD-10-CM

## 2013-08-08 DIAGNOSIS — Z1379 Encounter for other screening for genetic and chromosomal anomalies: Secondary | ICD-10-CM

## 2013-08-08 MED ORDER — DOXYLAMINE-PYRIDOXINE 10-10 MG PO TBEC: DELAYED_RELEASE_TABLET | ORAL | Status: DC

## 2013-08-08 NOTE — Progress Notes (Signed)
p-71   Bedside U/S showed IUP with FHT of 173 BPM and CRL 21.31mm

## 2013-08-08 NOTE — Progress Notes (Signed)
   Subjective:    Deborah Edwards is a [id number] [redacted]w[redacted]d being seen today for her first obstetrical visit.  Her obstetrical history is significant for NSVD x4. This pregnancy is unplanned but welcomed.  Patient does intend to breast feed. Pregnancy history fully reviewed.  Patient reports nausea.  She has never taken medication for nausea but is interested in trying something.   Filed Vitals:   08/08/13 0923  BP: 104/64  Weight: 68.04 kg (150 lb)    HISTORY: OB History  Gravida Para Term Preterm AB SAB TAB Ectopic Multiple Living  5 4 4       4     # Outcome Date GA Lbr Len/2nd Weight Sex Delivery Anes PTL Lv  5 CUR           4 TRM 06/20/12 [redacted]w[redacted]d 05:09 / 00:18 3.856 kg (8 lb 8 oz) M SVD None  Y  3 TRM 06/27/08 [redacted]w[redacted]d  3.771 kg (8 lb 5 oz) M SVD EPI N Y     Comments: spontaneous labor  2 TRM 01/25/06 [redacted]w[redacted]d  4.026 kg (8 lb 14 oz) M SVD EPI N Y     Comments: spontaneous labor  1 TRM 01/30/02 [redacted]w[redacted]d  3.912 kg (8 lb 10 oz) M SVD EPI N Y     Past Medical History  Diagnosis Date  . Irregular heart beat   . Abnormal Pap smear     colpo ok; 2011   Past Surgical History  Procedure Laterality Date  . Wisdom tooth extraction     Family History  Problem Relation Age of Onset  . Heart failure Father   . Diabetes Maternal Grandmother   . Cancer Neg Hx      Exam    Uterus:     Pelvic Exam: Deferred--Pap 06/2013 with neg cytology, positive HRHPV--cotesting in 1 year per [facility]  Physical Examination:  General appearance: alert, well appearing, and in no distress, oriented to person, place, and time and acyanotic, in no respiratory distress   Neurologic: oriented, normal, gait normal; reflexes normal and symmetric   Extremities: normal strength, tone, and muscle mass, ROM of all joints is normal   HEENT neck supple with midline trachea and thyroid without masses   Mouth/Teeth mucous membranes moist, pharynx normal without lesions and dental hygiene good   Neck supple and no masses   Cardiovascular: regular rate and rhythm   Respiratory:  appears well, vitals normal, no respiratory distress, acyanotic, normal RR, ear and throat exam is normal, neck free of mass or lymphadenopathy, chest clear, no wheezing, crepitations, rhonchi, normal symmetric air entry   Abdomen: soft, non-tender; bowel sounds normal; no masses,  no organomegaly   Urinary: not examined      Assessment:    Pregnancy: [id number] Patient Active Problem List   Diagnosis Date Noted  . Supervision of normal subsequent pregnancy 08/11/2013  . Cervical high risk HPV (human papillomavirus) test positive 06/23/2013        Plan:     Initial labs drawn. Prenatal vitamins. Diclegis Rx to pharmacy. Problem list reviewed and updated. Genetic Screening discussed First Screen: ordered.  Ultrasound discussed; fetal survey: requested.  Follow up in 4 weeks. 50% of 30 min visit spent on counseling and coordination of care.     LEFTWICH-KIRBY, Abanoub Hanken 08/11/2013

## 2013-08-09 LAB — OBSTETRIC PANEL
ANTIBODY SCREEN: NEGATIVE
BASOS ABS: 0 10*3/uL (ref 0.0–0.1)
BASOS PCT: 0 % (ref 0–1)
EOS PCT: 1 % (ref 0–5)
Eosinophils Absolute: 0.1 10*3/uL (ref 0.0–0.7)
HEMATOCRIT: 39.8 % (ref 36.0–46.0)
Hemoglobin: 13.5 g/dL (ref 12.0–15.0)
Hepatitis B Surface Ag: NEGATIVE
LYMPHS PCT: 27 % (ref 12–46)
Lymphs Abs: 2.1 10*3/uL (ref 0.7–4.0)
MCH: 28.2 pg (ref 26.0–34.0)
MCHC: 33.9 g/dL (ref 30.0–36.0)
MCV: 83.1 fL (ref 78.0–100.0)
MONO ABS: 0.5 10*3/uL (ref 0.1–1.0)
Monocytes Relative: 6 % (ref 3–12)
Neutro Abs: 5.2 10*3/uL (ref 1.7–7.7)
Neutrophils Relative %: 66 % (ref 43–77)
Platelets: 391 10*3/uL (ref 150–400)
RBC: 4.79 MIL/uL (ref 3.87–5.11)
RDW: 13.7 % (ref 11.5–15.5)
RUBELLA: 2.38 {index} — AB (ref <0.90)
Rh Type: NEGATIVE
WBC: 7.9 10*3/uL (ref 4.0–10.5)

## 2013-08-09 LAB — GC/CHLAMYDIA PROBE AMP
CT PROBE, AMP APTIMA: NEGATIVE
GC Probe RNA: NEGATIVE

## 2013-08-09 LAB — CULTURE, URINE COMPREHENSIVE
Colony Count: NO GROWTH
Organism ID, Bacteria: NO GROWTH

## 2013-08-09 LAB — HIV ANTIBODY (ROUTINE TESTING W REFLEX): HIV: NONREACTIVE

## 2013-08-11 DIAGNOSIS — Z3480 Encounter for supervision of other normal pregnancy, unspecified trimester: Secondary | ICD-10-CM | POA: Insufficient documentation

## 2013-08-18 ENCOUNTER — Other Ambulatory Visit: Payer: Self-pay | Admitting: Obstetrics & Gynecology

## 2013-08-18 DIAGNOSIS — Z3682 Encounter for antenatal screening for nuchal translucency: Secondary | ICD-10-CM

## 2013-09-05 ENCOUNTER — Other Ambulatory Visit: Payer: Self-pay

## 2013-09-05 ENCOUNTER — Ambulatory Visit (HOSPITAL_COMMUNITY)
Admission: RE | Admit: 2013-09-05 | Discharge: 2013-09-05 | Disposition: A | Discharge status: Home / Self Care | Payer: Managed Care, Other (non HMO) | Source: Ambulatory Visit | Attending: Obstetrics & Gynecology | Admitting: Obstetrics & Gynecology

## 2013-09-05 ENCOUNTER — Other Ambulatory Visit: Payer: Self-pay | Admitting: Obstetrics & Gynecology

## 2013-09-05 DIAGNOSIS — IMO0002 Reserved for concepts with insufficient information to code with codable children: Secondary | ICD-10-CM

## 2013-09-05 DIAGNOSIS — Z36 Encounter for antenatal screening of mother: Secondary | ICD-10-CM | POA: Insufficient documentation

## 2013-09-05 DIAGNOSIS — Z3480 Encounter for supervision of other normal pregnancy, unspecified trimester: Secondary | ICD-10-CM

## 2013-09-05 DIAGNOSIS — Z0489 Encounter for examination and observation for other specified reasons: Secondary | ICD-10-CM

## 2013-09-05 DIAGNOSIS — Z3682 Encounter for antenatal screening for nuchal translucency: Secondary | ICD-10-CM

## 2013-09-05 IMAGING — US US MFM FETAL NUCHAL TRANSLUCENCY
1 series · 13 of 27 positions shown · non-contrast
Comparison: none

[Series 1: us mfm fetal nuchal translucency · 0.19mm/px · 13 of 27 slices shown]
[im 2/27]
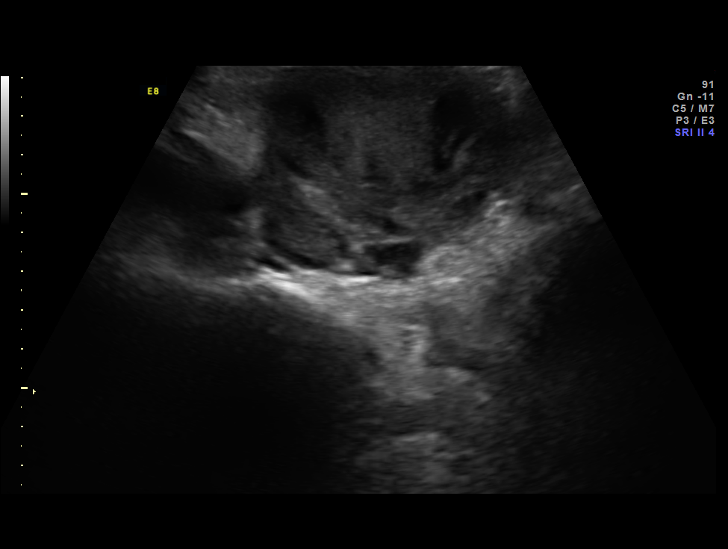
[im 4/27]
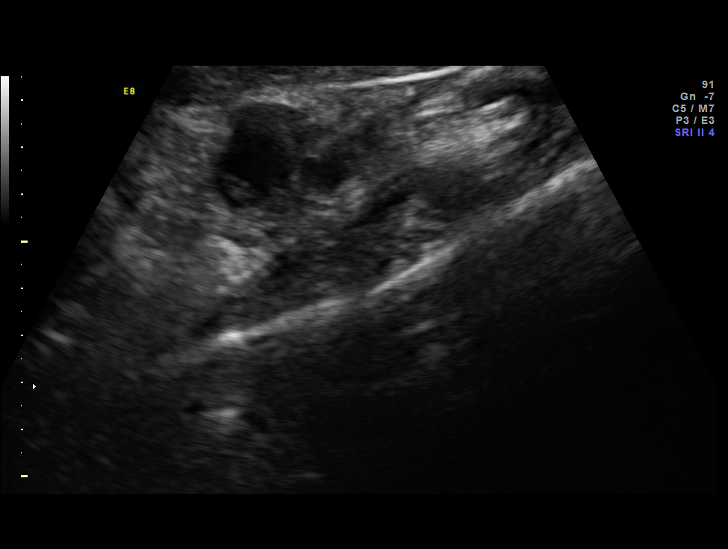
[im 6/27]
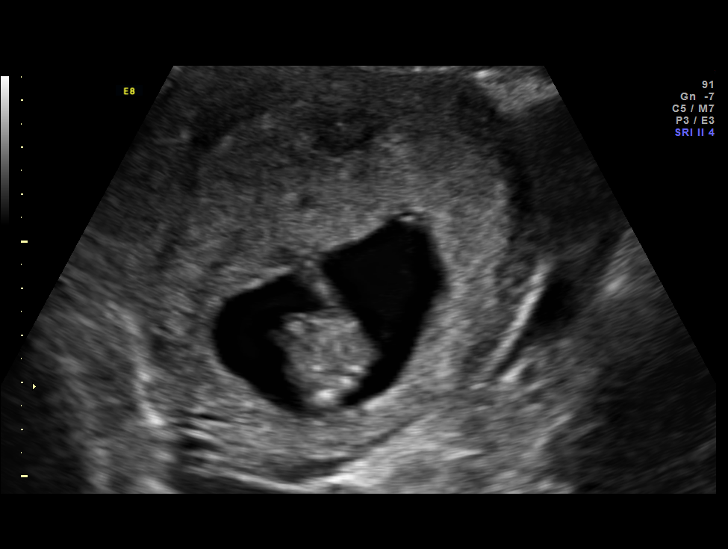
[im 8/27]
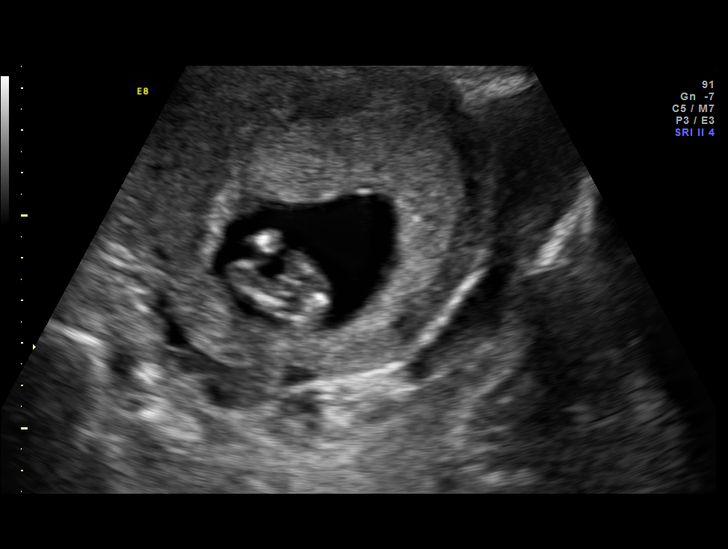
[im 10/27]
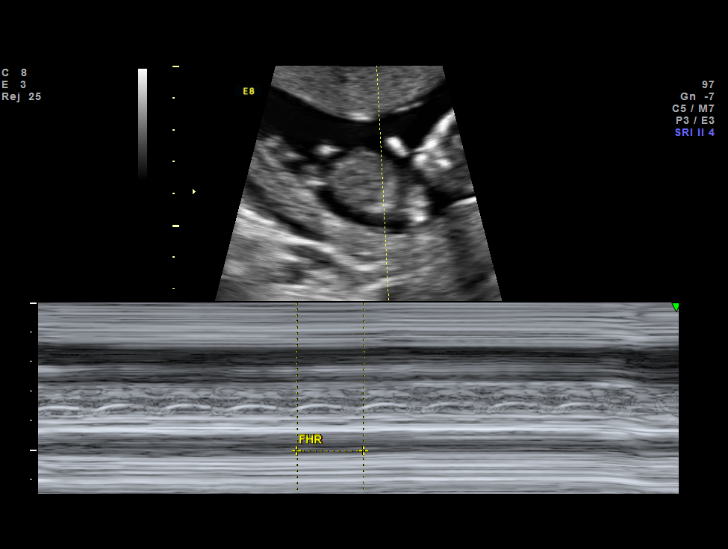
[im 12/27]
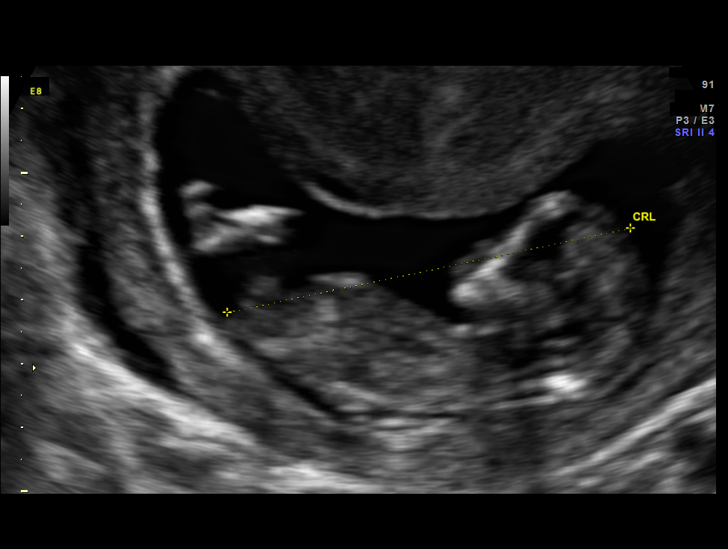
[im 14/27]
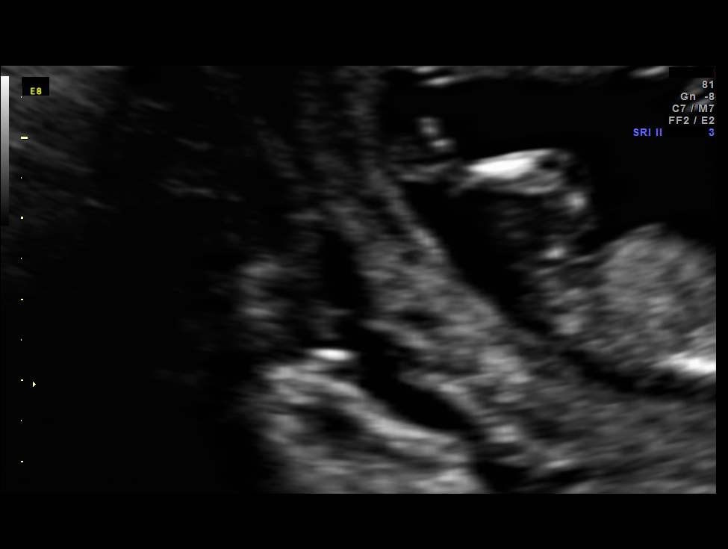
[im 16/27]
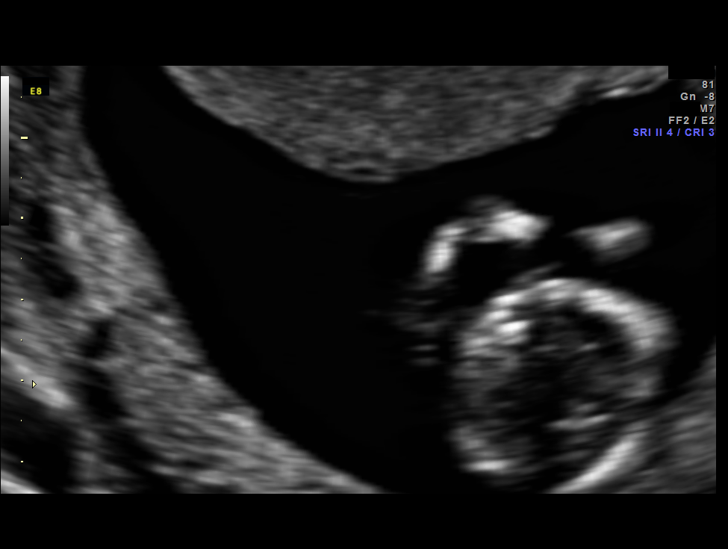
[im 18/27]
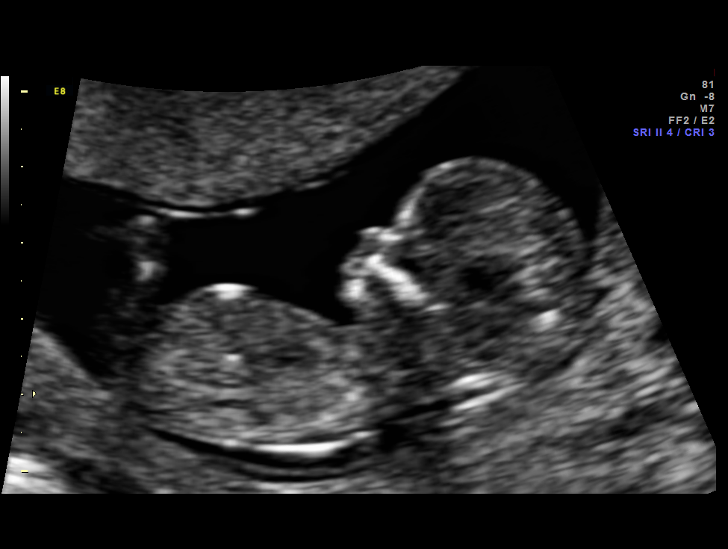
[im 20/27]
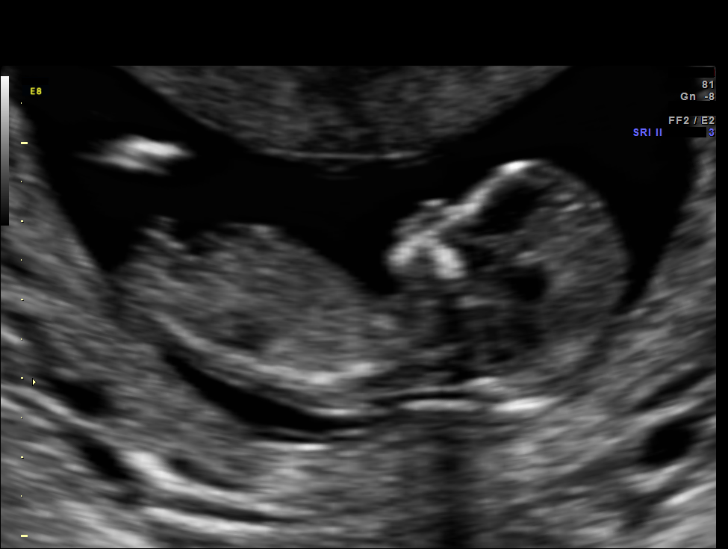
[im 22/27]
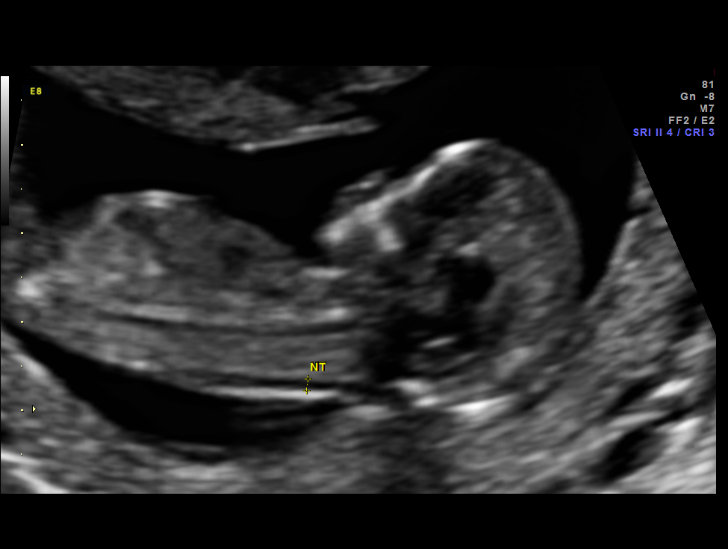
[im 24/27]
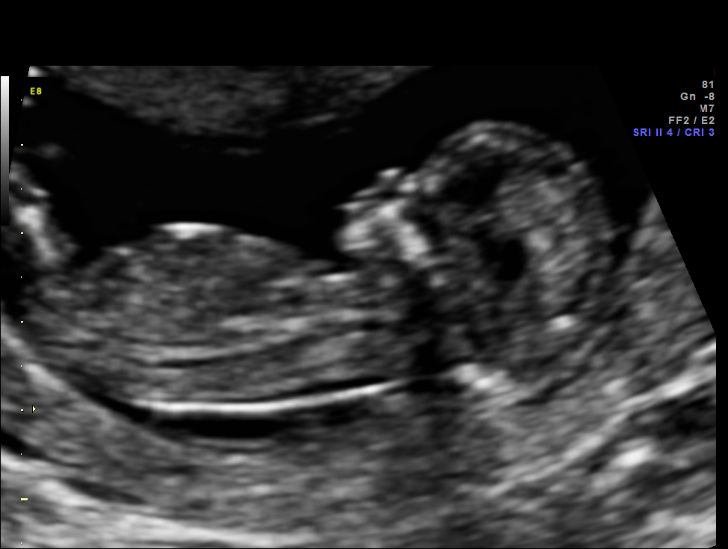
[im 26/27]
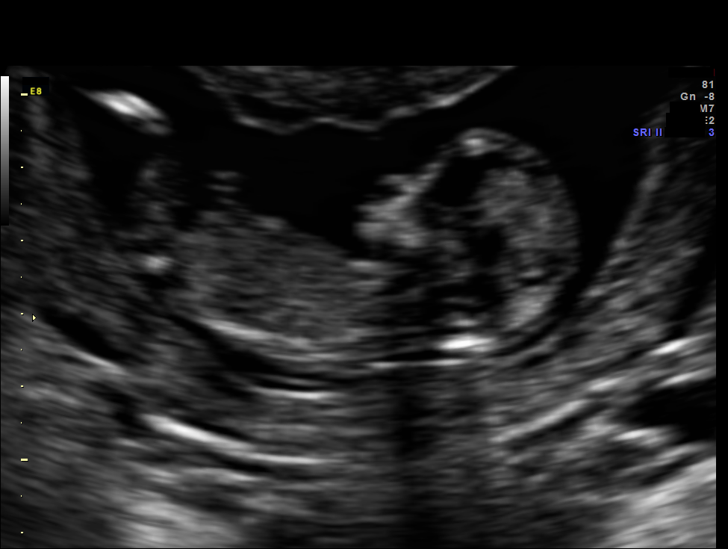

[13 of 27 positions shown; findings below may reference images not displayed]

OBSTETRICS REPORT
                      (Signed Final 09/05/2013 [DATE])

Service(s) Provided

Indications

 First trimester aneuploidy screen (NT)
Fetal Evaluation

 Num Of Fetuses:    1
 Fetal Heart Rate:  150                          bpm
 Cardiac Activity:  Observed
 Presentation:      Variable
 Placenta:          Anterior, above cervical os

 Amniotic Fluid
 AFI FV:      Subjectively within normal limits
Gestational Age

 LMP:           12w 6d        Date:  06/07/13                 EDD:   03/14/14
 Best:          12w 6d     Det. By:  LMP  (06/07/13)          EDD:   03/14/14
1st Trimester Genetic Sonogram Screening

 CRL:            64.6  mm    G. Age:   12w 5d                 EDD:   03/15/14
 Nuc Trans:       1.7  mm
 Nasal Bone:                 Present
Cervix Uterus Adnexa

 Cervix:       Normal appearance by transabdominal scan. Appears
               closed, without funnelling.
 Left Ovary:    Within normal limits.
 Right Ovary:   Within normal limits.
Impression

 SIUP at 04w9d
 NT= 1.7mm
 nasal bone present
 fetal morphology is gestational age appropriate
Recommendations

 1. patient sent to the lab for analytes
 2. fetal survey in 6 weeks

 questions or concerns.

## 2013-09-06 ENCOUNTER — Ambulatory Visit (HOSPITAL_COMMUNITY): Payer: Managed Care, Other (non HMO)

## 2013-09-06 ENCOUNTER — Other Ambulatory Visit (HOSPITAL_COMMUNITY): Payer: Managed Care, Other (non HMO)

## 2013-09-09 ENCOUNTER — Encounter: Payer: Self-pay | Admitting: *Deleted

## 2013-09-11 ENCOUNTER — Encounter: Payer: Self-pay | Admitting: Obstetrics & Gynecology

## 2013-09-12 ENCOUNTER — Ambulatory Visit (INDEPENDENT_AMBULATORY_CARE_PROVIDER_SITE_OTHER): Payer: Managed Care, Other (non HMO) | Admitting: Advanced Practice Midwife

## 2013-09-12 VITALS — BP 119/69 | HR 90 | Wt 153.0 lb

## 2013-09-12 DIAGNOSIS — Z3480 Encounter for supervision of other normal pregnancy, unspecified trimester: Secondary | ICD-10-CM

## 2013-09-12 NOTE — Progress Notes (Signed)
Reviewed labs

## 2013-09-12 NOTE — Patient Instructions (Signed)
Breastfeeding Deciding to breastfeed is one of the best choices you can make for you and your baby. A change in hormones during pregnancy causes your breast tissue to grow and increases the number and size of your milk ducts. These hormones also allow proteins, sugars, and fats from your blood supply to make breast milk in your milk-producing glands. Hormones prevent breast milk from being released before your baby is born as well as prompt milk flow after birth. Once breastfeeding has begun, thoughts of your baby, as well as his or her sucking or crying, can stimulate the release of milk from your milk-producing glands.  BENEFITS OF BREASTFEEDING For Your Baby  Your first milk (colostrum) helps your baby's digestive system function better.   There are antibodies in your milk that help your baby fight off infections.   Your baby has a lower incidence of asthma, allergies, and sudden infant death syndrome.   The nutrients in breast milk are better for your baby than infant formulas and are designed uniquely for your baby's needs.   Breast milk improves your baby's brain development.   Your baby is less likely to develop other conditions, such as childhood obesity, asthma, or type 2 diabetes mellitus.  For You   Breastfeeding helps to create a very special bond between you and your baby.   Breastfeeding is convenient. Breast milk is always available at the correct temperature and costs nothing.   Breastfeeding helps to burn calories and helps you lose the weight gained during pregnancy.   Breastfeeding makes your uterus contract to its prepregnancy size faster and slows bleeding (lochia) after you give birth.   Breastfeeding helps to lower your risk of developing type 2 diabetes mellitus, osteoporosis, and breast or ovarian cancer later in life. SIGNS THAT YOUR BABY IS HUNGRY Early Signs of Hunger  Increased alertness or activity.  Stretching.  Movement of the head from  side to side.  Movement of the head and opening of the mouth when the corner of the mouth or cheek is stroked (rooting).  Increased sucking sounds, smacking lips, cooing, sighing, or squeaking.  Hand-to-mouth movements.  Increased sucking of fingers or hands. Late Signs of Hunger  Fussing.  Intermittent crying. Extreme Signs of Hunger Signs of extreme hunger will require calming and consoling before your baby will be able to breastfeed successfully. Do not wait for the following signs of extreme hunger to occur before you initiate breastfeeding:   Restlessness.  A loud, strong cry.   Screaming. BREASTFEEDING BASICS Breastfeeding Initiation  Find a comfortable place to sit or lie down, with your neck and back well supported.  Place a pillow or rolled up blanket under your baby to bring him or her to the level of your breast (if you are seated). Nursing pillows are specially designed to help support your arms and your baby while you breastfeed.  Make sure that your baby's abdomen is facing your abdomen.   Gently massage your breast. With your fingertips, massage from your chest wall toward your nipple in a circular motion. This encourages milk flow. You may need to continue this action during the feeding if your milk flows slowly.  Support your breast with 4 fingers underneath and your thumb above your nipple. Make sure your fingers are well away from your nipple and your baby's mouth.   Stroke your baby's lips gently with your finger or nipple.   When your baby's mouth is open wide enough, quickly bring your baby to your  breast, placing your entire nipple and as much of the colored area around your nipple (areola) as possible into your baby's mouth.   More areola should be visible above your baby's upper lip than below the lower lip.   Your baby's tongue should be between his or her lower gum and your breast.   Ensure that your baby's mouth is correctly positioned  around your nipple (latched). Your baby's lips should create a seal on your breast and be turned out (everted).  It is common for your baby to suck about 2 3 minutes in order to start the flow of breast milk. Latching Teaching your baby how to latch on to your breast properly is very important. An improper latch can cause nipple pain and decreased milk supply for you and poor weight gain in your baby. Also, if your baby is not latched onto your nipple properly, he or she may swallow some air during feeding. This can make your baby fussy. Burping your baby when you switch breasts during the feeding can help to get rid of the air. However, teaching your baby to latch on properly is still the best way to prevent fussiness from swallowing air while breastfeeding. Signs that your baby has successfully latched on to your nipple:    Silent tugging or silent sucking, without causing you pain.   Swallowing heard between every 3 4 sucks.    Muscle movement above and in front of his or her ears while sucking.  Signs that your baby has not successfully latched on to nipple:   Sucking sounds or smacking sounds from your baby while breastfeeding.  Nipple pain. If you think your baby has not latched on correctly, slip your finger into the corner of your baby's mouth to break the suction and place it between your baby's gums. Attempt breastfeeding initiation again. Signs of Successful Breastfeeding Signs from your baby:   A gradual decrease in the number of sucks or complete cessation of sucking.   Falling asleep.   Relaxation of his or her body.   Retention of a small amount of milk in his or her mouth.   Letting go of your breast by himself or herself. Signs from you:  Breasts that have increased in firmness, weight, and size 1 3 hours after feeding.   Breasts that are softer immediately after breastfeeding.  Increased milk volume, as well as a change in milk consistency and color by  the 5th day of breastfeeding.   Nipples that are not sore, cracked, or bleeding. Signs That Your [employer] is Getting Enough Milk  Wetting at least 3 diapers in a 24-hour period. The urine should be clear and pale yellow by age 64411 days.  At least 3 stools in a 24-hour period by age 64411 days. The stool should be soft and yellow.  At least 3 stools in a 24-hour period by age 644 days. The stool should be seedy and yellow.  No loss of weight greater than 10% of birth weight during the first 22 days of age.  Average weight gain of 4 7 ounces (120 210 mL) per week after age 64 days.  Consistent daily weight gain by age 60 days, without weight loss after the age of 2 weeks. After a feeding, your baby may spit up a small amount. This is common. BREASTFEEDING FREQUENCY AND DURATION Frequent feeding will help you make more milk and can prevent sore nipples and breast engorgement. Breastfeed when you feel the need to reduce  the fullness of your breasts or when your baby shows signs of hunger. This is called "breastfeeding on demand." Avoid introducing a pacifier to your baby while you are working to establish breastfeeding (the first 4 6 weeks after your baby is born). After this time you may choose to use a pacifier. Research has shown that pacifier use during the first year of a baby's life decreases the risk of sudden infant death syndrome (SIDS). Allow your baby to feed on each breast as long as he or she wants. Breastfeed until your baby is finished feeding. When your baby unlatches or falls asleep while feeding from the first breast, offer the second breast. Because newborns are often sleepy in the first few weeks of life, you may need to awaken your baby to get him or her to feed. Breastfeeding times will vary from baby to baby. However, the following rules can serve as a guide to help you ensure that your baby is properly fed:  Newborns (babies 4 weeks of age or younger) may breastfeed every 1 3  hours.  Newborns should not go longer than 3 hours during the day or 5 hours during the night without breastfeeding.  You should breastfeed your baby a minimum of 8 times in a 24-hour period until you begin to introduce solid foods to your baby at around 6 months of age. BREAST MILK PUMPING Pumping and storing breast milk allows you to ensure that your baby is exclusively fed your breast milk, even at times when you are unable to breastfeed. This is especially important if you are going back to work while you are still breastfeeding or when you are not able to be present during feedings. Your lactation consultant can give you guidelines on how long it is safe to store breast milk.  A breast pump is a machine that allows you to pump milk from your breast into a sterile bottle. The pumped breast milk can then be stored in a refrigerator or freezer. Some breast pumps are operated by hand, while others use electricity. Ask your lactation consultant which type will work best for you. Breast pumps can be purchased, but some hospitals and breastfeeding support groups lease breast pumps on a monthly basis. A lactation consultant can teach you how to hand express breast milk, if you prefer not to use a pump.  CARING FOR YOUR BREASTS WHILE YOU BREASTFEED Nipples can become dry, cracked, and sore while breastfeeding. The following recommendations can help keep your breasts moisturized and healthy:  Avoid using soap on your nipples.   Wear a supportive bra. Although not required, special nursing bras and tank tops are designed to allow access to your breasts for breastfeeding without taking off your entire bra or top. Avoid wearing underwire style bras or extremely tight bras.  Air dry your nipples for 3 4minutes after each feeding.   Use only cotton bra pads to absorb leaked breast milk. Leaking of breast milk between feedings is normal.   Use lanolin on your nipples after breastfeeding. Lanolin helps to  maintain your skin's normal moisture barrier. If you use pure lanolin you do not need to wash it off before feeding your baby again. Pure lanolin is not toxic to your baby. You may also hand express a few drops of breast milk and gently massage that milk into your nipples and allow the milk to air dry. In the first few weeks after giving birth, some women experience extremely full breasts (engorgement). Engorgement can make   your breasts feel heavy, warm, and tender to the touch. Engorgement peaks within 3 5 days after you give birth. The following recommendations can help ease engorgement:  Completely empty your breasts while breastfeeding or pumping. You may want to start by applying warm, moist heat (in the shower or with warm water-soaked hand towels) just before feeding or pumping. This increases circulation and helps the milk flow. If your baby does not completely empty your breasts while breastfeeding, pump any extra milk after he or she is finished.  Wear a snug bra (nursing or regular) or tank top for 1 2 days to signal your body to slightly decrease milk production.  Apply ice packs to your breasts, unless this is too uncomfortable for you.  Make sure that your baby is latched on and positioned properly while breastfeeding. If engorgement persists after 48 hours of following these recommendations, contact your health care provider or a Science writer. OVERALL HEALTH CARE RECOMMENDATIONS WHILE BREASTFEEDING  Eat healthy foods. Alternate between meals and snacks, eating 3 of each per day. Because what you eat affects your breast milk, some of the foods may make your baby more irritable than usual. Avoid eating these foods if you are sure that they are negatively affecting your baby.  Drink milk, fruit juice, and water to satisfy your thirst (about 10 glasses a day).   Rest often, relax, and continue to take your prenatal vitamins to prevent fatigue, stress, and anemia.  Continue  breast self-awareness checks.  Avoid chewing and smoking tobacco.  Avoid alcohol and drug use. Some medicines that may be harmful to your baby can pass through breast milk. It is important to ask your health care provider before taking any medicine, including all over-the-counter and prescription medicine as well as vitamin and herbal supplements. It is possible to become pregnant while breastfeeding. If birth control is desired, ask your health care provider about options that will be safe for your baby. SEEK MEDICAL CARE IF:   You feel like you want to stop breastfeeding or have become frustrated with breastfeeding.  You have painful breasts or nipples.  Your nipples are cracked or bleeding.  Your breasts are red, tender, or warm.  You have a swollen area on either breast.  You have a fever or chills.  You have nausea or vomiting.  You have drainage other than breast milk from your nipples.  Your breasts do not become full before feedings by the 5th day after you give birth.  You feel sad and depressed.  Your baby is too sleepy to eat well.  Your baby is having trouble sleeping.   Your baby is wetting less than 3 diapers in a 24-hour period.  Your baby has less than 3 stools in a 24-hour period.  Your baby's skin or the white part of his or her eyes becomes yellow.   Your baby is not gaining weight by 74 days of age. SEEK IMMEDIATE MEDICAL CARE IF:   Your baby is overly tired (lethargic) and does not want to wake up and feed.  Your baby develops an unexplained fever. Document Released: 04/28/2005 Document Revised: 12/29/2012 Document Reviewed: 10/20/2012 [facility] Patient Information 2014 [place].

## 2013-10-17 ENCOUNTER — Ambulatory Visit (HOSPITAL_COMMUNITY)
Admission: RE | Admit: 2013-10-17 | Discharge: 2013-10-17 | Disposition: A | Discharge status: Home / Self Care | Payer: Managed Care, Other (non HMO) | Source: Ambulatory Visit | Attending: Obstetrics & Gynecology | Admitting: Obstetrics & Gynecology

## 2013-10-17 ENCOUNTER — Other Ambulatory Visit: Payer: Self-pay | Admitting: Obstetrics & Gynecology

## 2013-10-17 ENCOUNTER — Encounter: Payer: Self-pay | Admitting: Obstetrics & Gynecology

## 2013-10-17 DIAGNOSIS — Z3689 Encounter for other specified antenatal screening: Secondary | ICD-10-CM | POA: Insufficient documentation

## 2013-10-17 DIAGNOSIS — Z0489 Encounter for examination and observation for other specified reasons: Secondary | ICD-10-CM

## 2013-10-17 DIAGNOSIS — IMO0002 Reserved for concepts with insufficient information to code with codable children: Secondary | ICD-10-CM

## 2013-10-17 DIAGNOSIS — Z363 Encounter for antenatal screening for malformations: Secondary | ICD-10-CM | POA: Insufficient documentation

## 2013-10-17 DIAGNOSIS — Z1389 Encounter for screening for other disorder: Secondary | ICD-10-CM | POA: Insufficient documentation

## 2013-10-17 IMAGING — US US OB COMP +14 WK
1 series · 12 of 28 positions shown · non-contrast
Comparison: none

[Series 1: us ob comp +14 wk · 12 of 93 slices shown]
[im 4/93]
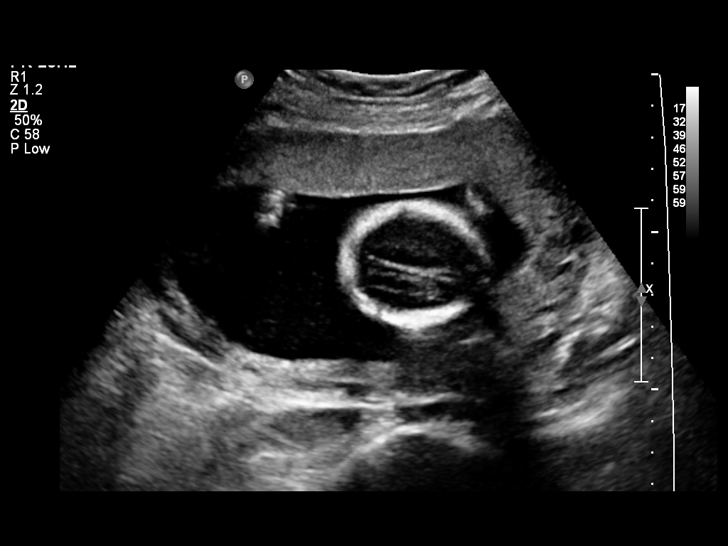
[im 11/93]
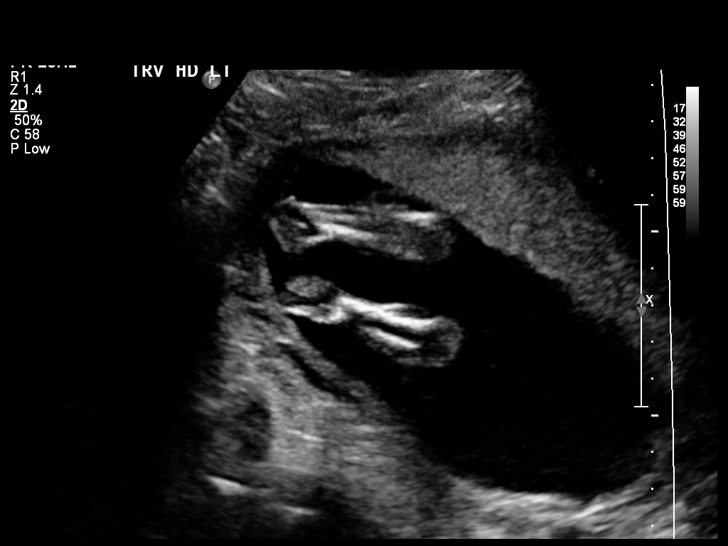
[im 18/93]
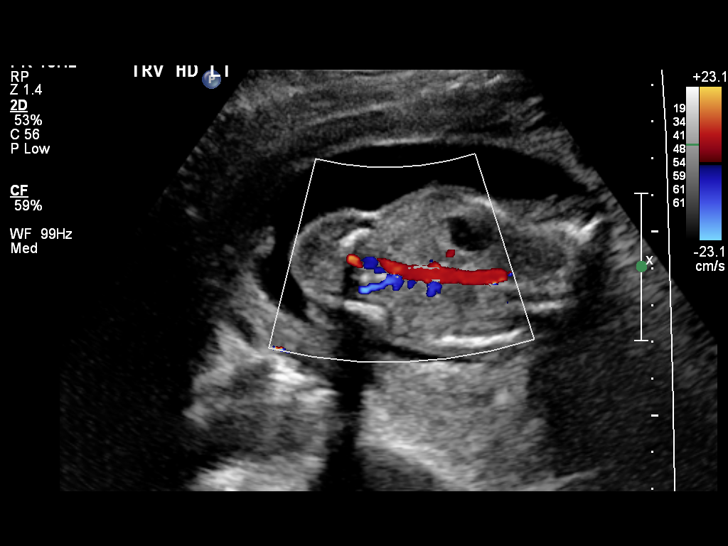
[im 28/93]
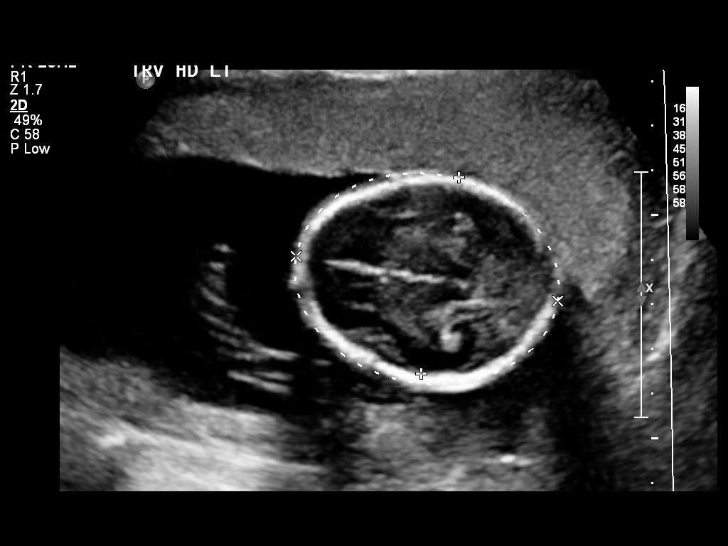
[im 35/93]
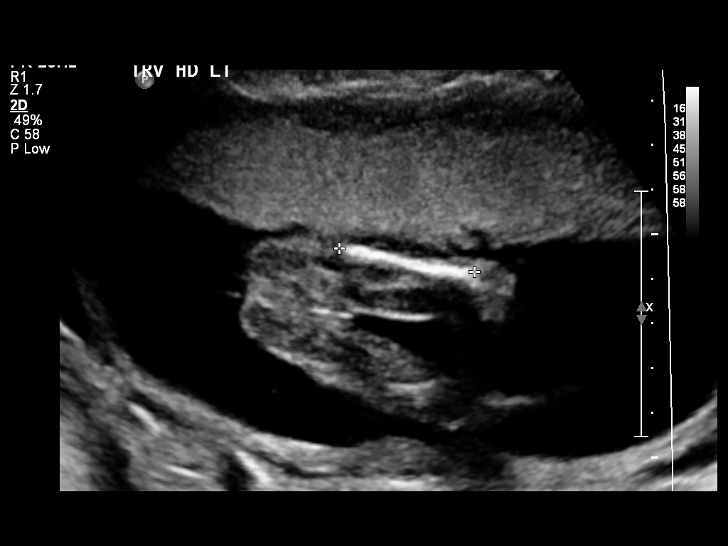
[im 41/93]
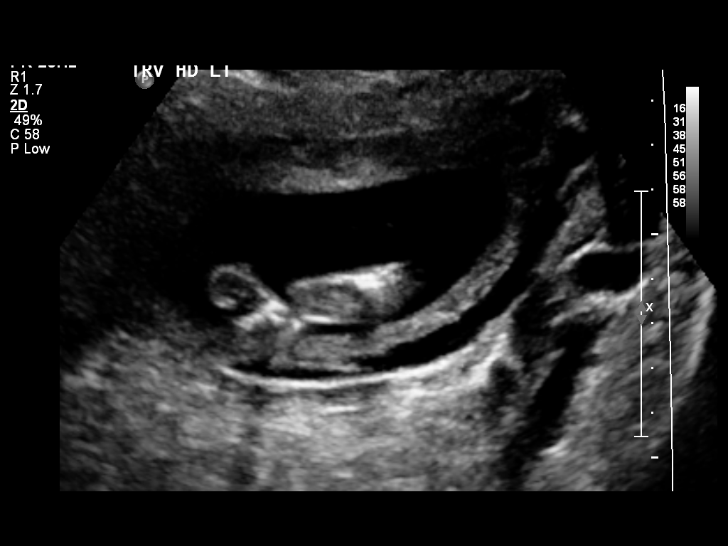
[im 52/93]
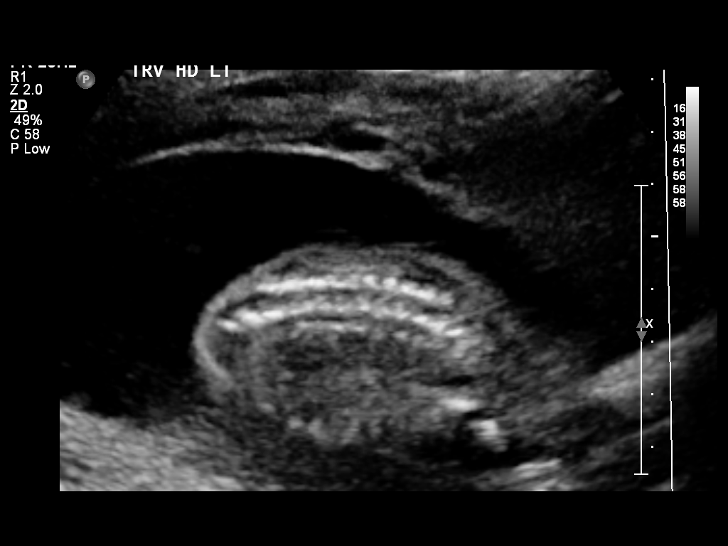
[im 58/93]
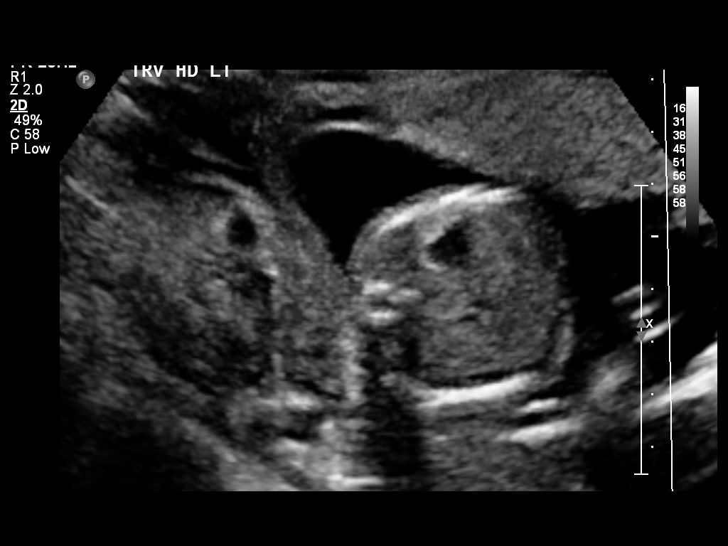
[im 65/93]
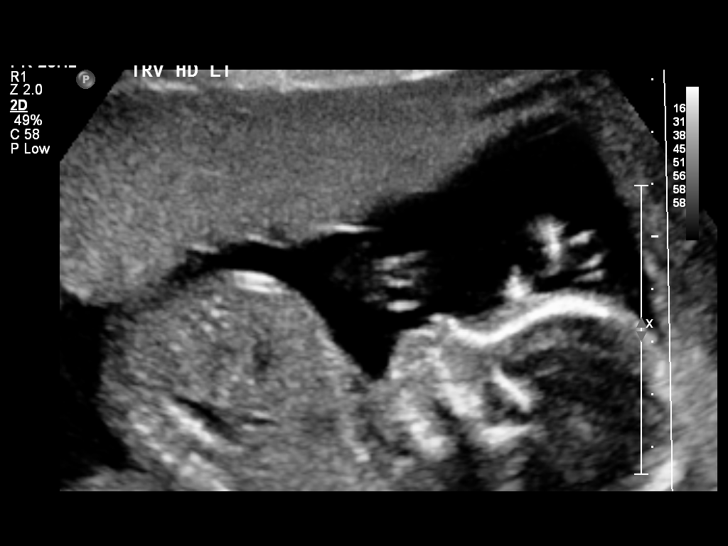
[im 75/93]
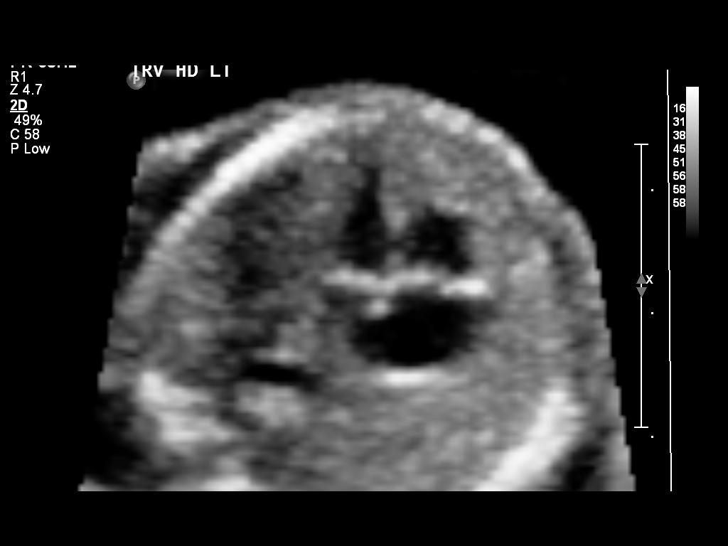
[im 82/93]
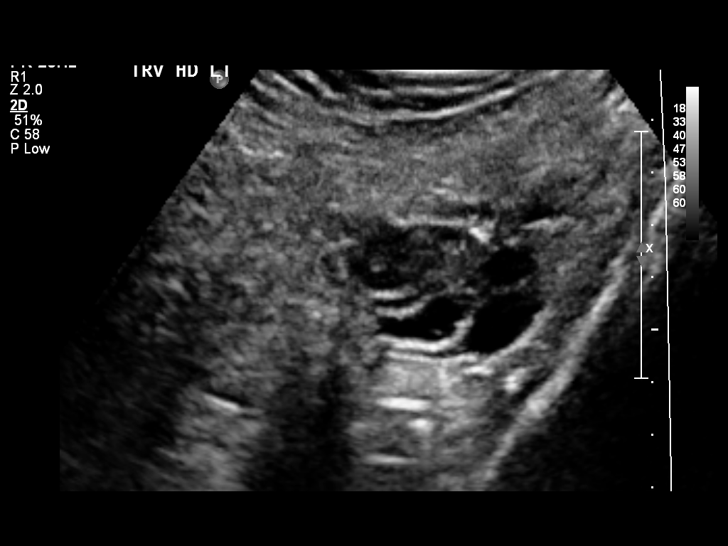
[im 89/93]
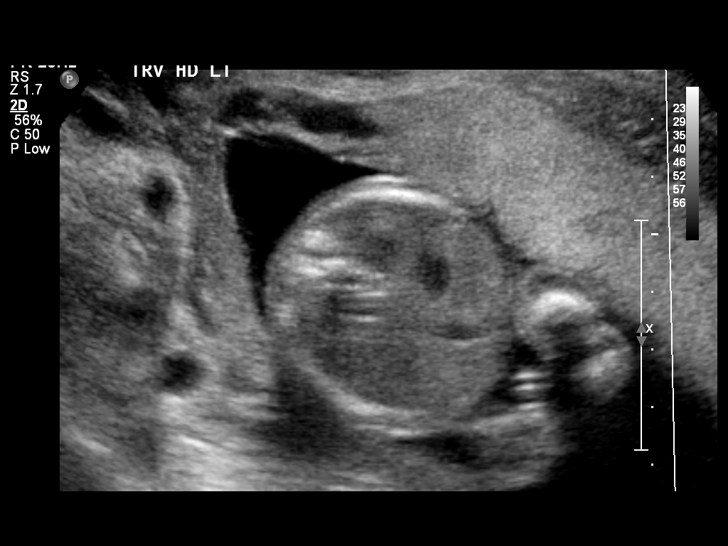

[12 of 28 positions shown; findings below may reference images not displayed]

OBSTETRICS REPORT
                      (Signed Final 10/17/2013 [DATE])

Service(s) Provided

 US OB COMP + 14 WK                                    76805.1
Indications

 Basic anatomic survey
Fetal Evaluation

 Num Of Fetuses:    1
 Fetal Heart Rate:  144                          bpm
 Cardiac Activity:  Observed
 Presentation:      Transverse, head to
                    maternal left
 Placenta:          Anterior, above cervical os
 P. Cord            Not well visualized
 Insertion:

 Amniotic Fluid
 AFI FV:      Subjectively within normal limits
                                             Larg Pckt:     4.6  cm
Biometry

 BPD:     44.5  mm     G. Age:  19w 3d                CI:        70.47   70 - 86
                                                      FL/HC:      18.2   16.1 -

 HC:       169  mm     G. Age:  19w 4d       75  %    HC/AC:      1.20   1.09 -

 AC:     140.4  mm     G. Age:  19w 3d       65  %    FL/BPD:
 FL:      30.7  mm     G. Age:  19w 4d       67  %    FL/AC:      21.9   20 - 24
 HUM:     29.4  mm     G. Age:  19w 4d       70  %
 CER:     19.5  mm     G. Age:  18w 5d       47  %
 NFT:     4.13  mm

 Est. FW:     294  gm    0 lb 10 oz      55  %
Gestational Age

 LMP:           18w 6d        Date:  06/07/13                 EDD:   03/14/14
 U/S Today:     19w 4d                                        EDD:   03/09/14
 Best:          18w 6d     Det. By:  LMP  (06/07/13)          EDD:   03/14/14
Anatomy
 Cranium:          Appears normal         Aortic Arch:      Not well visualized
 Fetal Cavum:      Appears normal         Ductal Arch:      Not well visualized
 Ventricles:       Appears normal         Diaphragm:        Appears normal
 Choroid Plexus:   Appears normal         Stomach:          Appears normal, left
                                                            sided
 Cerebellum:       Appears normal         Abdomen:          Appears normal
 Posterior Fossa:  Appears normal         Abdominal Wall:   Appears nml (cord
                                                            insert, abd wall)
 Nuchal Fold:      Appears normal         Cord Vessels:     Appears normal (3
                                                            vessel cord)
 Face:             Appears normal         Kidneys:          Appear normal
                   (orbits and profile)
 Lips:             Appears normal         Bladder:          Appears normal
 Heart:            Appears normal         Spine:            Appears normal
                   (4CH, axis, and
                   situs)
 RVOT:             Not well visualized    Lower             Appears normal
                                          Extremities:
 LVOT:             Appears normal         Upper             Appears normal
                                          Extremities:

 Other:  Parents do not wish to know sex of fetus. Nasal bone visualized.
         Heels and 5th digit visualized. Technically difficult due to fetal position.
Targeted Anatomy

 Fetal Central Nervous System
 Lat. Ventricles:  6.1                    Cisterna Magna:
Cervix Uterus Adnexa

 Cervical Length:    4.4      cm

 Cervix:       Normal appearance by transabdominal scan.
 Left Ovary:    Within normal limits.
 Right Ovary:   Previously seen
Impression

 SIUPat 77w2d
 EFW 55th%
 No dysmorphic features
 Limitations as detailed above
 No previa
Recommendations

 Recommend follow up attempt to complete fetal survey in 8
 weeks.

 questions or concerns.

## 2013-10-28 ENCOUNTER — Ambulatory Visit (INDEPENDENT_AMBULATORY_CARE_PROVIDER_SITE_OTHER): Payer: Managed Care, Other (non HMO) | Admitting: Advanced Practice Midwife

## 2013-10-28 VITALS — BP 117/77 | HR 77 | Wt 167.0 lb

## 2013-10-28 DIAGNOSIS — Z3480 Encounter for supervision of other normal pregnancy, unspecified trimester: Secondary | ICD-10-CM

## 2013-10-28 DIAGNOSIS — Z3482 Encounter for supervision of other normal pregnancy, second trimester: Secondary | ICD-10-CM

## 2013-10-28 NOTE — Progress Notes (Signed)
[place] normal except limited views of aorta.  Offer f/u US in early August at next visit.  Does not want to know gender.  Feels well. Some ligament pain.

## 2013-10-28 NOTE — Patient Instructions (Signed)
Second Trimester of Pregnancy The second trimester is from week 13 through week 28, months 4 through 6. The second trimester is often a time when you feel your best. Your body has also adjusted to being pregnant, and you begin to feel better physically. Usually, morning sickness has lessened or quit completely, you may have more energy, and you may have an increase in appetite. The second trimester is also a time when the fetus is growing rapidly. At the end of the sixth month, the fetus is about 9 inches long and weighs about 1 pounds. You will likely begin to feel the baby move (quickening) between 18 and 20 weeks of the pregnancy. BODY CHANGES Your body goes through many changes during pregnancy. The changes vary from woman to woman.   Your weight will continue to increase. You will notice your lower abdomen bulging out.  You may begin to get stretch marks on your hips, abdomen, and breasts.  You may develop headaches that can be relieved by medicines approved by your health care provider.  You may urinate more often because the fetus is pressing on your bladder.  You may develop or continue to have heartburn as a result of your pregnancy.  You may develop constipation because certain hormones are causing the muscles that push waste through your intestines to slow down.  You may develop hemorrhoids or swollen, bulging veins (varicose veins).  You may have back pain because of the weight gain and pregnancy hormones relaxing your joints between the bones in your pelvis and as a result of a shift in weight and the muscles that support your balance.  Your breasts will continue to grow and be tender.  Your gums may bleed and may be sensitive to brushing and flossing.  Dark spots or blotches (chloasma, mask of pregnancy) may develop on your face. This will likely fade after the baby is born.  A dark line from your belly button to the pubic area (linea nigra) may appear. This will likely fade  after the baby is born.  You may have changes in your hair. These can include thickening of your hair, rapid growth, and changes in texture. Some women also have hair loss during or after pregnancy, or hair that feels dry or thin. Your hair will most likely return to normal after your baby is born. WHAT TO EXPECT AT YOUR PRENATAL VISITS During a routine prenatal visit:  You will be weighed to make sure you and the fetus are growing normally.  Your blood pressure will be taken.  Your abdomen will be measured to track your baby's growth.  The fetal heartbeat will be listened to.  Any test results from the previous visit will be discussed. Your health care provider may ask you:  How you are feeling.  If you are feeling the baby move.  If you have had any abnormal symptoms, such as leaking fluid, bleeding, severe headaches, or abdominal cramping.  If you have any questions. Other tests that may be performed during your second trimester include:  Blood tests that check for:  Low iron levels (anemia).  Gestational diabetes (between 24 and 28 weeks).  Rh antibodies.  Urine tests to check for infections, diabetes, or protein in the urine.  An ultrasound to confirm the proper growth and development of the baby.  An amniocentesis to check for possible genetic problems.  Fetal screens for spina bifida and Down syndrome. HOME CARE INSTRUCTIONS   Avoid all smoking, herbs, alcohol, and unprescribed   drugs. These chemicals affect the formation and growth of the baby.  Follow your health care provider's instructions regarding medicine use. There are medicines that are either safe or unsafe to take during pregnancy.  Exercise only as directed by your health care provider. Experiencing uterine cramps is a good sign to stop exercising.  Continue to eat regular, healthy meals.  Wear a good support bra for breast tenderness.  Do not use hot tubs, steam rooms, or saunas.  Wear your  seat belt at all times when driving.  Avoid raw meat, uncooked cheese, cat litter boxes, and soil used by cats. These carry germs that can cause birth defects in the baby.  Take your prenatal vitamins.  Try taking a stool softener (if your health care provider approves) if you develop constipation. Eat more high-fiber foods, such as fresh vegetables or fruit and whole grains. Drink plenty of fluids to keep your urine clear or pale yellow.  Take warm sitz baths to soothe any pain or discomfort caused by hemorrhoids. Use hemorrhoid cream if your health care provider approves.  If you develop varicose veins, wear support hose. Elevate your feet for 15 minutes, 3-4 times a day. Limit salt in your diet.  Avoid heavy lifting, wear low heel shoes, and practice good posture.  Rest with your legs elevated if you have leg cramps or low back pain.  Visit your dentist if you have not gone yet during your pregnancy. Use a soft toothbrush to brush your teeth and be gentle when you floss.  A sexual relationship may be continued unless your health care provider directs you otherwise.  Continue to go to all your prenatal visits as directed by your health care provider. SEEK MEDICAL CARE IF:   You have dizziness.  You have mild pelvic cramps, pelvic pressure, or nagging pain in the abdominal area.  You have persistent nausea, vomiting, or diarrhea.  You have a bad smelling vaginal discharge.  You have pain with urination. SEEK IMMEDIATE MEDICAL CARE IF:   You have a fever.  You are leaking fluid from your vagina.  You have spotting or bleeding from your vagina.  You have severe abdominal cramping or pain.  You have rapid weight gain or loss.  You have shortness of breath with chest pain.  You notice sudden or extreme swelling of your face, hands, ankles, feet, or legs.  You have not felt your baby move in over an hour.  You have severe headaches that do not go away with  medicine.  You have vision changes. Document Released: 04/22/2001 Document Revised: 05/03/2013 Document Reviewed: 06/29/2012 [employer] Patient Information 2015 [employer]. This information is not intended to replace advice given to you by your health care provider. Make sure you discuss any questions you have with your health care provider.  

## 2013-12-05 ENCOUNTER — Ambulatory Visit (INDEPENDENT_AMBULATORY_CARE_PROVIDER_SITE_OTHER): Payer: Managed Care, Other (non HMO) | Admitting: Advanced Practice Midwife

## 2013-12-05 ENCOUNTER — Encounter: Payer: Self-pay | Admitting: Advanced Practice Midwife

## 2013-12-05 VITALS — BP 113/74 | HR 78 | Wt 175.0 lb

## 2013-12-05 DIAGNOSIS — Z3492 Encounter for supervision of normal pregnancy, unspecified, second trimester: Secondary | ICD-10-CM

## 2013-12-05 DIAGNOSIS — Z3480 Encounter for supervision of other normal pregnancy, unspecified trimester: Secondary | ICD-10-CM

## 2013-12-05 NOTE — Patient Instructions (Signed)
Second Trimester of Pregnancy The second trimester is from week 13 through week 28, months 4 through 6. The second trimester is often a time when you feel your best. Your body has also adjusted to being pregnant, and you begin to feel better physically. Usually, morning sickness has lessened or quit completely, you may have more energy, and you may have an increase in appetite. The second trimester is also a time when the fetus is growing rapidly. At the end of the sixth month, the fetus is about 9 inches long and weighs about 1 pounds. You will likely begin to feel the baby move (quickening) between 18 and 20 weeks of the pregnancy. BODY CHANGES Your body goes through many changes during pregnancy. The changes vary from woman to woman.   Your weight will continue to increase. You will notice your lower abdomen bulging out.  You may begin to get stretch marks on your hips, abdomen, and breasts.  You may develop headaches that can be relieved by medicines approved by your health care provider.  You may urinate more often because the fetus is pressing on your bladder.  You may develop or continue to have heartburn as a result of your pregnancy.  You may develop constipation because certain hormones are causing the muscles that push waste through your intestines to slow down.  You may develop hemorrhoids or swollen, bulging veins (varicose veins).  You may have back pain because of the weight gain and pregnancy hormones relaxing your joints between the bones in your pelvis and as a result of a shift in weight and the muscles that support your balance.  Your breasts will continue to grow and be tender.  Your gums may bleed and may be sensitive to brushing and flossing.  Dark spots or blotches (chloasma, mask of pregnancy) may develop on your face. This will likely fade after the baby is born.  A dark line from your belly button to the pubic area (linea nigra) may appear. This will likely fade  after the baby is born.  You may have changes in your hair. These can include thickening of your hair, rapid growth, and changes in texture. Some women also have hair loss during or after pregnancy, or hair that feels dry or thin. Your hair will most likely return to normal after your baby is born. WHAT TO EXPECT AT YOUR PRENATAL VISITS During a routine prenatal visit:  You will be weighed to make sure you and the fetus are growing normally.  Your blood pressure will be taken.  Your abdomen will be measured to track your baby's growth.  The fetal heartbeat will be listened to.  Any test results from the previous visit will be discussed. Your health care provider may ask you:  How you are feeling.  If you are feeling the baby move.  If you have had any abnormal symptoms, such as leaking fluid, bleeding, severe headaches, or abdominal cramping.  If you have any questions. Other tests that may be performed during your second trimester include:  Blood tests that check for:  Low iron levels (anemia).  Gestational diabetes (between 24 and 28 weeks).  Rh antibodies.  Urine tests to check for infections, diabetes, or protein in the urine.  An ultrasound to confirm the proper growth and development of the baby.  An amniocentesis to check for possible genetic problems.  Fetal screens for spina bifida and Down syndrome. HOME CARE INSTRUCTIONS   Avoid all smoking, herbs, alcohol, and unprescribed   drugs. These chemicals affect the formation and growth of the baby.  Follow your health care provider's instructions regarding medicine use. There are medicines that are either safe or unsafe to take during pregnancy.  Exercise only as directed by your health care provider. Experiencing uterine cramps is a good sign to stop exercising.  Continue to eat regular, healthy meals.  Wear a good support bra for breast tenderness.  Do not use hot tubs, steam rooms, or saunas.  Wear your  seat belt at all times when driving.  Avoid raw meat, uncooked cheese, cat litter boxes, and soil used by cats. These carry germs that can cause birth defects in the baby.  Take your prenatal vitamins.  Try taking a stool softener (if your health care provider approves) if you develop constipation. Eat more high-fiber foods, such as fresh vegetables or fruit and whole grains. Drink plenty of fluids to keep your urine clear or pale yellow.  Take warm sitz baths to soothe any pain or discomfort caused by hemorrhoids. Use hemorrhoid cream if your health care provider approves.  If you develop varicose veins, wear support hose. Elevate your feet for 15 minutes, 3-4 times a day. Limit salt in your diet.  Avoid heavy lifting, wear low heel shoes, and practice good posture.  Rest with your legs elevated if you have leg cramps or low back pain.  Visit your dentist if you have not gone yet during your pregnancy. Use a soft toothbrush to brush your teeth and be gentle when you floss.  A sexual relationship may be continued unless your health care provider directs you otherwise.  Continue to go to all your prenatal visits as directed by your health care provider. SEEK MEDICAL CARE IF:   You have dizziness.  You have mild pelvic cramps, pelvic pressure, or nagging pain in the abdominal area.  You have persistent nausea, vomiting, or diarrhea.  You have a bad smelling vaginal discharge.  You have pain with urination. SEEK IMMEDIATE MEDICAL CARE IF:   You have a fever.  You are leaking fluid from your vagina.  You have spotting or bleeding from your vagina.  You have severe abdominal cramping or pain.  You have rapid weight gain or loss.  You have shortness of breath with chest pain.  You notice sudden or extreme swelling of your face, hands, ankles, feet, or legs.  You have not felt your baby move in over an hour.  You have severe headaches that do not go away with  medicine.  You have vision changes. Document Released: 04/22/2001 Document Revised: 05/03/2013 Document Reviewed: 06/29/2012 [employer] Patient Information 2015 [employer]. This information is not intended to replace advice given to you by your health care provider. Make sure you discuss any questions you have with your health care provider.  

## 2013-12-05 NOTE — Progress Notes (Signed)
Doing well  Some UCs at night, not painful. Will schedule f/u US for anatomy. Glucola 2 weeks

## 2013-12-05 NOTE — Progress Notes (Signed)
Insomnia has increased.  RF on Diclegis

## 2013-12-19 ENCOUNTER — Ambulatory Visit (INDEPENDENT_AMBULATORY_CARE_PROVIDER_SITE_OTHER): Payer: Managed Care, Other (non HMO) | Admitting: Advanced Practice Midwife

## 2013-12-19 VITALS — BP 117/69 | HR 93 | Wt 179.0 lb

## 2013-12-19 DIAGNOSIS — Z3480 Encounter for supervision of other normal pregnancy, unspecified trimester: Secondary | ICD-10-CM

## 2013-12-19 DIAGNOSIS — O360990 Maternal care for other rhesus isoimmunization, unspecified trimester, not applicable or unspecified: Secondary | ICD-10-CM

## 2013-12-19 DIAGNOSIS — Z23 Encounter for immunization: Secondary | ICD-10-CM

## 2013-12-19 DIAGNOSIS — O360121 Maternal care for anti-D [Rh] antibodies, second trimester, fetus 1: Secondary | ICD-10-CM

## 2013-12-19 DIAGNOSIS — Z3482 Encounter for supervision of other normal pregnancy, second trimester: Secondary | ICD-10-CM

## 2013-12-19 LAB — CBC
HCT: 34.4 % — ABNORMAL LOW (ref 36.0–46.0)
HEMOGLOBIN: 11.4 g/dL — AB (ref 12.0–15.0)
MCH: 27.8 pg (ref 26.0–34.0)
MCHC: 33.1 g/dL (ref 30.0–36.0)
MCV: 83.9 fL (ref 78.0–100.0)
PLATELETS: 334 10*3/uL (ref 150–400)
RBC: 4.1 MIL/uL (ref 3.87–5.11)
RDW: 13.6 % (ref 11.5–15.5)
WBC: 9.6 10*3/uL (ref 4.0–10.5)

## 2013-12-19 MED ORDER — TETANUS-DIPHTH-ACELL PERTUSSIS 5-2.5-18.5 LF-MCG/0.5 IM SUSP
0.5000 mL | Freq: Once | INTRAMUSCULAR | Status: AC
  Administered 2013-12-19: 0.5 mL via INTRAMUSCULAR

## 2013-12-19 MED ORDER — RHO D IMMUNE GLOBULIN 1500 UNIT/2ML IJ SOSY
300.0000 ug | PREFILLED_SYRINGE | Freq: Once | INTRAMUSCULAR | Status: AC
  Administered 2013-12-19: 300 ug via INTRAMUSCULAR

## 2013-12-19 NOTE — Progress Notes (Signed)
TdaP. Rhophylac. F/U US scheduled for incomplete anatomy 8/12.

## 2013-12-20 ENCOUNTER — Telehealth: Payer: Self-pay | Admitting: *Deleted

## 2013-12-20 LAB — RPR

## 2013-12-20 LAB — ANTIBODY SCREEN: Antibody Screen: NEGATIVE

## 2013-12-20 LAB — GLUCOSE TOLERANCE, 1 HOUR (50G) W/O FASTING: Glucose, 1 Hour GTT: 106 mg/dL (ref 70–140)

## 2013-12-20 NOTE — Telephone Encounter (Signed)
Pt notified of normal 1 hr GTT.

## 2013-12-21 ENCOUNTER — Ambulatory Visit (HOSPITAL_COMMUNITY)
Admission: RE | Admit: 2013-12-21 | Discharge: 2013-12-21 | Disposition: A | Discharge status: Home / Self Care | Payer: Managed Care, Other (non HMO) | Source: Ambulatory Visit | Attending: Advanced Practice Midwife | Admitting: Advanced Practice Midwife

## 2013-12-21 DIAGNOSIS — Z3492 Encounter for supervision of normal pregnancy, unspecified, second trimester: Secondary | ICD-10-CM

## 2013-12-21 DIAGNOSIS — Z3689 Encounter for other specified antenatal screening: Secondary | ICD-10-CM | POA: Diagnosis present

## 2013-12-21 IMAGING — US US OB FOLLOW-UP
1 series · 12 of 28 positions shown · non-contrast
Comparison: none

[Series 1: us ob follow up · 12 of 37 slices shown]
[im 2/37]
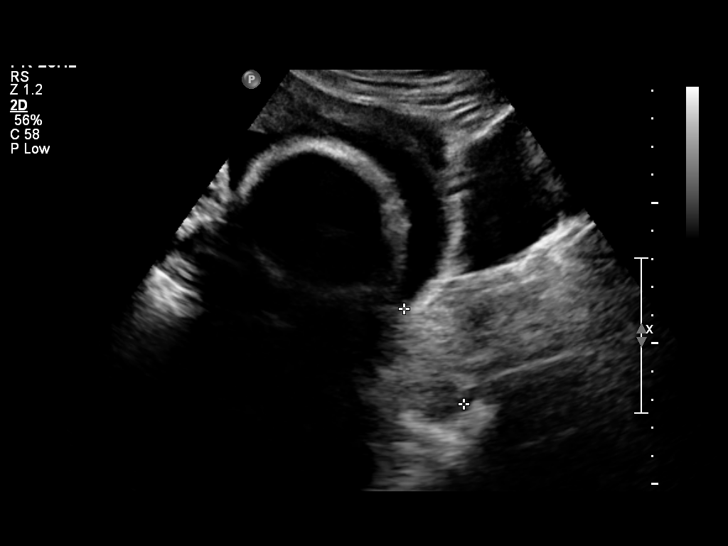
[im 5/37]
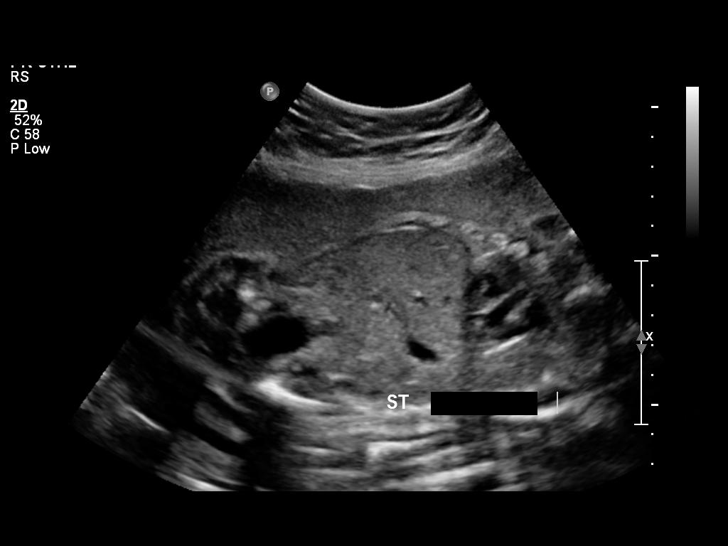
[im 7/37]
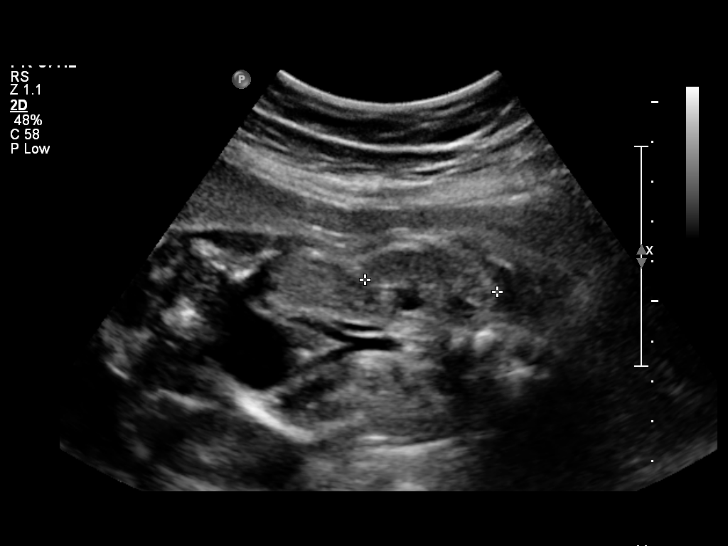
[im 11/37]
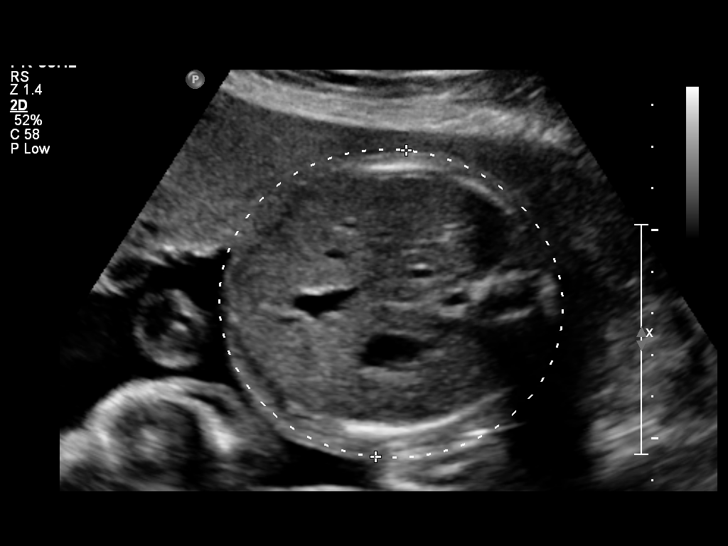
[im 14/37]
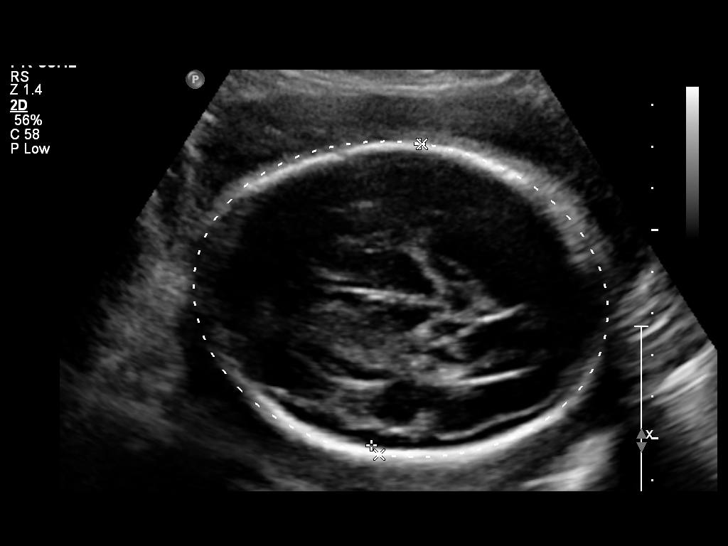
[im 17/37]
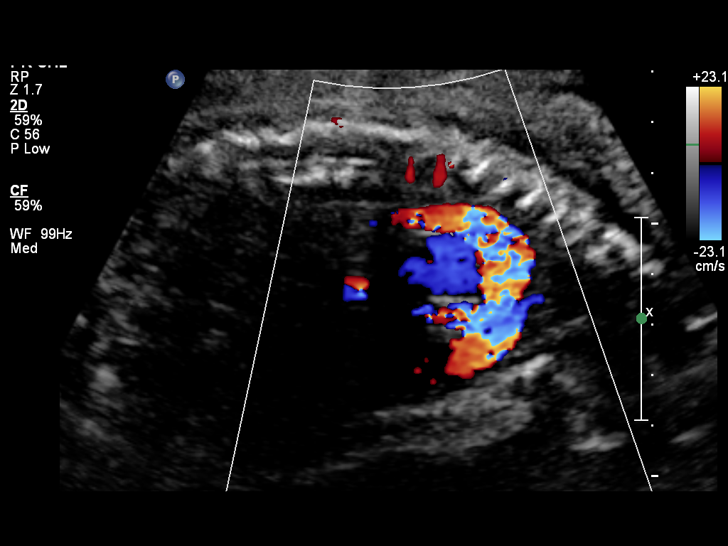
[im 21/37]
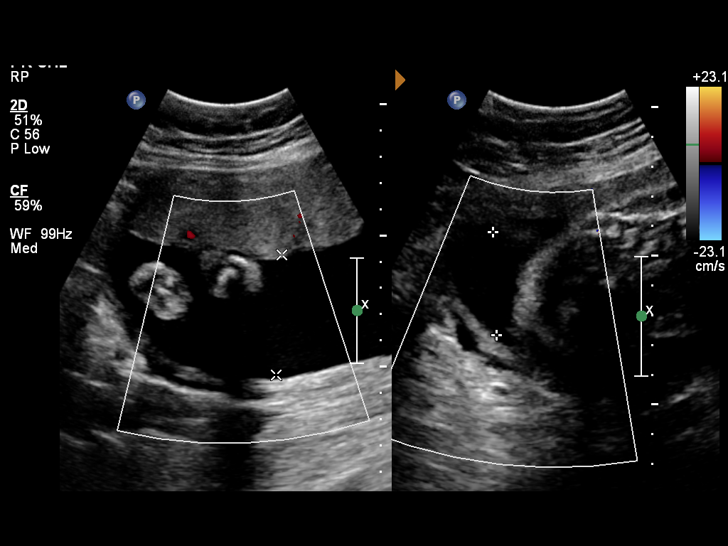
[im 23/37]
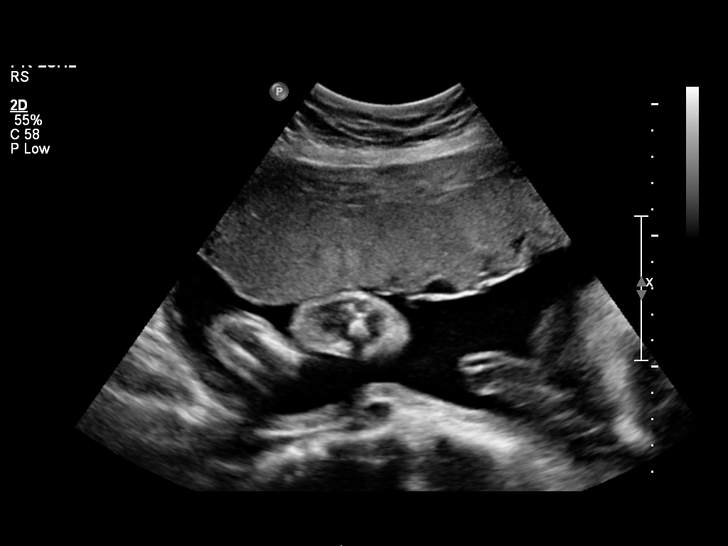
[im 26/37]
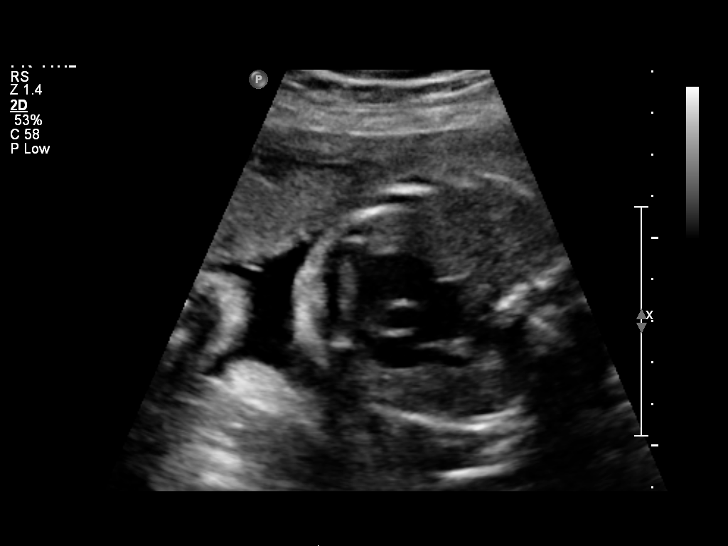
[im 30/37]
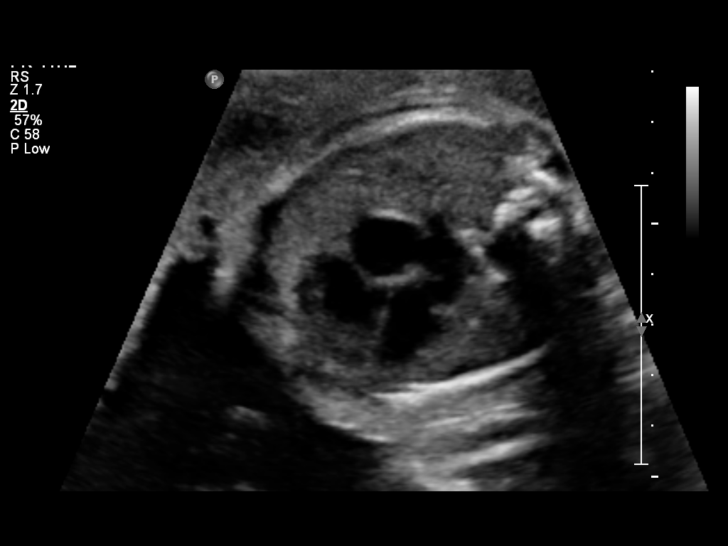
[im 33/37]
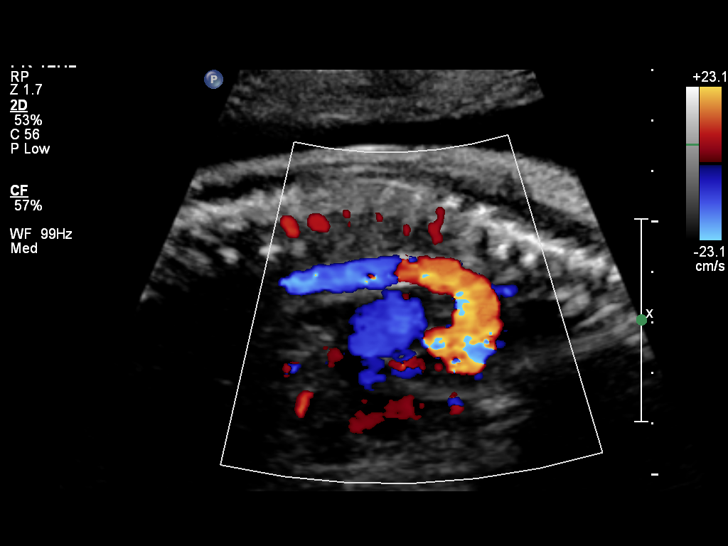
[im 35/37]
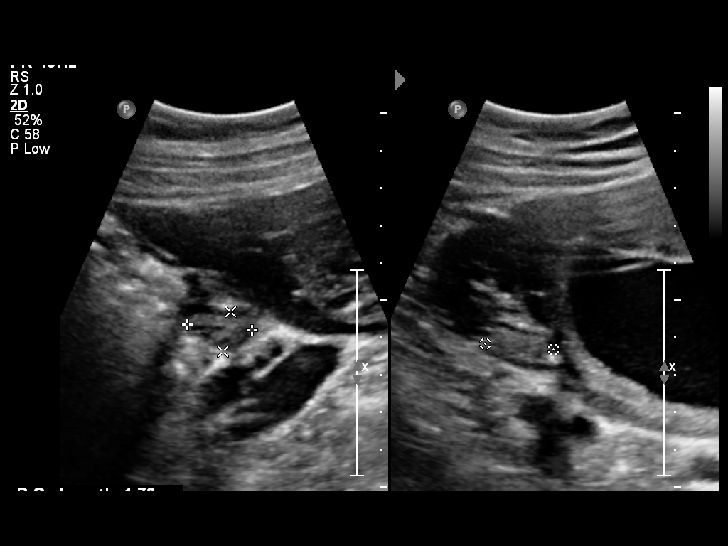

[12 of 28 positions shown; findings below may reference images not displayed]

OBSTETRICS REPORT
                      (Signed Final 12/21/2013 [DATE])

Service(s) Provided

 US OB FOLLOW UP                                       76816.1
Indications

 Evaluate anatomy not seen on prior sonogram
Fetal Evaluation

 Num Of Fetuses:    1
 Fetal Heart Rate:  149                          bpm
 Cardiac Activity:  Observed
 Presentation:      Cephalic
 Placenta:          Anterior, above cervical os
 P. Cord            Previously Visualized
 Insertion:

 Amniotic Fluid
 AFI FV:      Subjectively within normal limits
 AFI Sum:     14.32   cm       48  %Tile     Larg Pckt:    4.57  cm
 RUQ:   4.57    cm   RLQ:    2.67   cm    LUQ:   3.45    cm   LLQ:    3.63   cm
Biometry

 BPD:     73.3  mm     G. Age:  29w 3d                CI:        71.34   70 - 86
                                                      FL/HC:      20.0   18.8 -

 HC:     276.4  mm     G. Age:  30w 2d       81  %    HC/AC:      1.14   1.05 -

 AC:       243  mm     G. Age:  28w 4d       55  %    FL/BPD:     75.6   71 - 87
 FL:      55.4  mm     G. Age:  29w 1d       66  %    FL/AC:      22.8   20 - 24
 HUM:     51.2  mm     G. Age:  30w 0d       83  %

 Est. FW:    3136  gm    2 lb 14 oz      68  %
Gestational Age

 LMP:           28w 1d        Date:  06/07/13                 EDD:   03/14/14
 U/S Today:     29w 3d                                        EDD:   03/05/14
 Best:          28w 1d     Det. By:  LMP  (06/07/13)          EDD:   03/14/14
Anatomy

 Cranium:          Appears normal         Aortic Arch:      Appears normal
 Fetal Cavum:      Previously seen        Ductal Arch:      Appears normal
 Ventricles:       Appears normal         Diaphragm:        Appears normal
 Choroid Plexus:   Previously seen        Stomach:          Appears normal, left
                                                            sided
 Cerebellum:       Previously seen        Abdomen:          Appears normal
 Posterior Fossa:  Previously seen        Abdominal Wall:   Previously seen
 Nuchal Fold:      Not applicable (>20    Cord Vessels:     Previously seen
                   wks GA)
 Face:             Orbits and profile     Kidneys:          Appear normal
                   previously seen
 Lips:             Previously seen        Bladder:          Appears normal
 Heart:            Previously seen        Spine:            Previously seen
 RVOT:             Appears normal         Lower             Previously seen
                                          Extremities:
 LVOT:             Previously seen        Upper             Previously seen
                                          Extremities:

 Other:  Parents do not wish to know sex of fetus. Nasal bone previously
         visualized. Heels and 5th digit previously visualized. Technically
         difficult due to fetal position.
Targeted Anatomy

 Fetal Central Nervous System
 Lat. Ventricles:
Cervix Uterus Adnexa

 Cervical Length:    3.98     cm

 Cervix:       Normal appearance by transabdominal scan.
 Left Ovary:    Size(cm) L: 1.63 x W: 1.32 x H: 1.82  Volume(cc):
                Within normal limits.
 Right Ovary:   Size(cm) L: 1.73 x W: 1.82 x H: 1.08  Volume(cc):
                Within normal limits.
Impression

 Single IUP at 28w 1d
 Normal interval anatomy
 Fetal growth is appropriate (68th %tile)
 Anterior placenta without previa
 Normal amniotic fluid volume
Recommendations

 Follow-up ultrasounds as clinically indicated.

 questions or concerns.

## 2013-12-21 NOTE — Patient Instructions (Signed)
Rh0 [D] Immune Globulin injection  What is this medicine?  RhO [D] IMMUNE GLOBULIN (i MYOON GLOB yoo lin) is used to treat idiopathic thrombocytopenic purpura (ITP). This medicine is used in RhO negative mothers who are pregnant with a RhO positive child. It is also used after a transfusion of RhO positive blood into a RhO negative person.  This medicine may be used for other purposes; ask your health care provider or pharmacist if you have questions.  COMMON BRAND NAME(S): BayRho-D, HyperRHO S/D, MICRhoGAM, RhoGAM, Rhophylac, WinRho SDF  What should I tell my health care provider before I take this medicine?  They need to know if you have any of these conditions:  -bleeding disorders  -low levels of immunoglobulin A in the body  -no spleen  -an unusual or allergic reaction to human immune globulin, other medicines, foods, dyes, or preservatives  -pregnant or trying to get pregnant  -breast-feeding  How should I use this medicine?  This medicine is for injection into a muscle or into a vein. It is given by a health care professional in a hospital or clinic setting.  Talk to your pediatrician regarding the use of this medicine in children. This medicine is not approved for use in children.  Overdosage: If you think you have taken too much of this medicine contact a poison control center or emergency room at once.  NOTE: This medicine is only for you. Do not share this medicine with others.  What if I miss a dose?  It is important not to miss your dose. Call your doctor or health care professional if you are unable to keep an appointment.  What may interact with this medicine?  -live virus vaccines, like measles, mumps, or rubella  This list may not describe all possible interactions. Give your health care provider a list of all the medicines, herbs, non-prescription drugs, or dietary supplements you use. Also tell them if you smoke, drink alcohol, or use illegal drugs. Some items may interact with your  medicine.  What should I watch for while using this medicine?  This medicine is made from human blood. It may be possible to pass an infection in this medicine. Talk to your doctor about the risks and benefits of this medicine.  This medicine may interfere with live virus vaccines. Before you get live virus vaccines tell your health care professional if you have received this medicine within the past 3 months.  What side effects may I notice from receiving this medicine?  Side effects that you should report to your doctor or health care professional as soon as possible:  -allergic reactions like skin rash, itching or hives, swelling of the face, lips, or tongue  -breathing problems  -chest pain or tightness  -yellowing of the eyes or skin  Side effects that usually do not require medical attention (report to your doctor or health care professional if they continue or are bothersome):  -fever  -pain and tenderness at site where injected  This list may not describe all possible side effects. Call your doctor for medical advice about side effects. You may report side effects to FDA at 1-800-FDA-1088.  Where should I keep my medicine?  This drug is given in a hospital or clinic and will not be stored at home.  NOTE: This sheet is a summary. It may not cover all possible information. If you have questions about this medicine, talk to your doctor, pharmacist, or health care provider.   2015, [employer]. (

## 2013-12-25 ENCOUNTER — Encounter: Payer: Self-pay | Admitting: Advanced Practice Midwife

## 2013-12-27 ENCOUNTER — Encounter: Payer: Self-pay | Admitting: Advanced Practice Midwife

## 2014-01-09 ENCOUNTER — Ambulatory Visit (INDEPENDENT_AMBULATORY_CARE_PROVIDER_SITE_OTHER): Payer: Managed Care, Other (non HMO) | Admitting: Advanced Practice Midwife

## 2014-01-09 VITALS — BP 113/66 | HR 86 | Wt 183.0 lb

## 2014-01-09 DIAGNOSIS — Z3483 Encounter for supervision of other normal pregnancy, third trimester: Secondary | ICD-10-CM

## 2014-01-09 DIAGNOSIS — Z3480 Encounter for supervision of other normal pregnancy, unspecified trimester: Secondary | ICD-10-CM

## 2014-01-09 NOTE — Progress Notes (Signed)
Anatomy scan complete, normal. Reviewed 28 week labs.

## 2014-01-09 NOTE — Patient Instructions (Signed)
Third Trimester of Pregnancy The third trimester is from week 29 through week 42, months 7 through 9. The third trimester is a time when the fetus is growing rapidly. At the end of the ninth month, the fetus is about 20 inches in length and weighs 6-10 pounds.  BODY CHANGES Your body goes through many changes during pregnancy. The changes vary from woman to woman.   Your weight will continue to increase. You can expect to gain 25-35 pounds (11-16 kg) by the end of the pregnancy.  You may begin to get stretch marks on your hips, abdomen, and breasts.  You may urinate more often because the fetus is moving lower into your pelvis and pressing on your bladder.  You may develop or continue to have heartburn as a result of your pregnancy.  You may develop constipation because certain hormones are causing the muscles that push waste through your intestines to slow down.  You may develop hemorrhoids or swollen, bulging veins (varicose veins).  You may have pelvic pain because of the weight gain and pregnancy hormones relaxing your joints between the bones in your pelvis. Backaches may result from overexertion of the muscles supporting your posture.  You may have changes in your hair. These can include thickening of your hair, rapid growth, and changes in texture. Some women also have hair loss during or after pregnancy, or hair that feels dry or thin. Your hair will most likely return to normal after your baby is born.  Your breasts will continue to grow and be tender. A yellow discharge may leak from your breasts called colostrum.  Your belly button may stick out.  You may feel short of breath because of your expanding uterus.  You may notice the fetus "dropping," or moving lower in your abdomen.  You may have a bloody mucus discharge. This usually occurs a few days to a week before labor begins.  Your cervix becomes thin and soft (effaced) near your due date. WHAT TO EXPECT AT YOUR PRENATAL  EXAMS  You will have prenatal exams every 2 weeks until week 36. Then, you will have weekly prenatal exams. During a routine prenatal visit:  You will be weighed to make sure you and the fetus are growing normally.  Your blood pressure is taken.  Your abdomen will be measured to track your baby's growth.  The fetal heartbeat will be listened to.  Any test results from the previous visit will be discussed.  You may have a cervical check near your due date to see if you have effaced. At around 36 weeks, your caregiver will check your cervix. At the same time, your caregiver will also perform a test on the secretions of the vaginal tissue. This test is to determine if a type of bacteria, Group B streptococcus, is present. Your caregiver will explain this further. Your caregiver may ask you:  What your birth plan is.  How you are feeling.  If you are feeling the baby move.  If you have had any abnormal symptoms, such as leaking fluid, bleeding, severe headaches, or abdominal cramping.  If you have any questions. Other tests or screenings that may be performed during your third trimester include:  Blood tests that check for low iron levels (anemia).  Fetal testing to check the health, activity level, and growth of the fetus. Testing is done if you have certain medical conditions or if there are problems during the pregnancy. FALSE LABOR You may feel small, irregular contractions that   eventually go away. These are called Braxton Hicks contractions, or false labor. Contractions may last for hours, days, or even weeks before true labor sets in. If contractions come at regular intervals, intensify, or become painful, it is best to be seen by your caregiver.  SIGNS OF LABOR   Menstrual-like cramps.  Contractions that are 5 minutes apart or less.  Contractions that start on the top of the uterus and spread down to the lower abdomen and back.  A sense of increased pelvic pressure or back  pain.  A watery or bloody mucus discharge that comes from the vagina. If you have any of these signs before the 37th week of pregnancy, call your caregiver right away. You need to go to the hospital to get checked immediately. HOME CARE INSTRUCTIONS   Avoid all smoking, herbs, alcohol, and unprescribed drugs. These chemicals affect the formation and growth of the baby.  Follow your caregiver's instructions regarding medicine use. There are medicines that are either safe or unsafe to take during pregnancy.  Exercise only as directed by your caregiver. Experiencing uterine cramps is a good sign to stop exercising.  Continue to eat regular, healthy meals.  Wear a good support bra for breast tenderness.  Do not use hot tubs, steam rooms, or saunas.  Wear your seat belt at all times when driving.  Avoid raw meat, uncooked cheese, cat litter boxes, and soil used by cats. These carry germs that can cause birth defects in the baby.  Take your prenatal vitamins.  Try taking a stool softener (if your caregiver approves) if you develop constipation. Eat more high-fiber foods, such as fresh vegetables or fruit and whole grains. Drink plenty of fluids to keep your urine clear or pale yellow.  Take warm sitz baths to soothe any pain or discomfort caused by hemorrhoids. Use hemorrhoid cream if your caregiver approves.  If you develop varicose veins, wear support hose. Elevate your feet for 15 minutes, 3-4 times a day. Limit salt in your diet.  Avoid heavy lifting, wear low heal shoes, and practice good posture.  Rest a lot with your legs elevated if you have leg cramps or low back pain.  Visit your dentist if you have not gone during your pregnancy. Use a soft toothbrush to brush your teeth and be gentle when you floss.  A sexual relationship may be continued unless your caregiver directs you otherwise.  Do not travel far distances unless it is absolutely necessary and only with the approval  of your caregiver.  Take prenatal classes to understand, practice, and ask questions about the labor and delivery.  Make a trial run to the hospital.  Pack your hospital bag.  Prepare the baby's nursery.  Continue to go to all your prenatal visits as directed by your caregiver. SEEK MEDICAL CARE IF:  You are unsure if you are in labor or if your water has broken.  You have dizziness.  You have mild pelvic cramps, pelvic pressure, or nagging pain in your abdominal area.  You have persistent nausea, vomiting, or diarrhea.  You have a bad smelling vaginal discharge.  You have pain with urination. SEEK IMMEDIATE MEDICAL CARE IF:   You have a fever.  You are leaking fluid from your vagina.  You have spotting or bleeding from your vagina.  You have severe abdominal cramping or pain.  You have rapid weight loss or gain.  You have shortness of breath with chest pain.  You notice sudden or extreme swelling   of your face, hands, ankles, feet, or legs.  You have not felt your baby move in over an hour.  You have severe headaches that do not go away with medicine.  You have vision changes. Document Released: 04/22/2001 Document Revised: 05/03/2013 Document Reviewed: 06/29/2012 ExitCare Patient Information 2015 [employer]. This information is not intended to replace advice given to you by your health care provider. Make sure you discuss any questions you have with your health care provider.  

## 2014-01-23 ENCOUNTER — Ambulatory Visit (INDEPENDENT_AMBULATORY_CARE_PROVIDER_SITE_OTHER): Payer: Managed Care, Other (non HMO) | Admitting: Family

## 2014-01-23 ENCOUNTER — Encounter: Payer: Self-pay | Admitting: Family

## 2014-01-23 VITALS — BP 100/61 | HR 89 | Wt 185.1 lb

## 2014-01-23 DIAGNOSIS — Z3480 Encounter for supervision of other normal pregnancy, unspecified trimester: Secondary | ICD-10-CM

## 2014-01-23 DIAGNOSIS — Z3483 Encounter for supervision of other normal pregnancy, third trimester: Secondary | ICD-10-CM

## 2014-01-23 DIAGNOSIS — Z3493 Encounter for supervision of normal pregnancy, unspecified, third trimester: Secondary | ICD-10-CM

## 2014-01-23 NOTE — Progress Notes (Signed)
Doing well.  No questions or concerns.  Increased nervousness with new baby and having one year.  Working out who will get kids to and from school.

## 2014-02-03 ENCOUNTER — Ambulatory Visit (INDEPENDENT_AMBULATORY_CARE_PROVIDER_SITE_OTHER): Payer: Managed Care, Other (non HMO) | Admitting: Family

## 2014-02-03 VITALS — BP 118/79 | HR 97 | Wt 184.0 lb

## 2014-02-03 DIAGNOSIS — Z3483 Encounter for supervision of other normal pregnancy, third trimester: Secondary | ICD-10-CM

## 2014-02-03 DIAGNOSIS — Z3493 Encounter for supervision of normal pregnancy, unspecified, third trimester: Secondary | ICD-10-CM

## 2014-02-03 DIAGNOSIS — Z3480 Encounter for supervision of other normal pregnancy, unspecified trimester: Secondary | ICD-10-CM

## 2014-02-03 NOTE — Progress Notes (Signed)
Doing well, no questions.  Review testing at next visit.

## 2014-02-17 ENCOUNTER — Ambulatory Visit (INDEPENDENT_AMBULATORY_CARE_PROVIDER_SITE_OTHER): Payer: Managed Care, Other (non HMO) | Admitting: Family

## 2014-02-17 VITALS — BP 120/67 | HR 85 | Temp 98.1°F | Wt 186.0 lb

## 2014-02-17 DIAGNOSIS — Z3493 Encounter for supervision of normal pregnancy, unspecified, third trimester: Secondary | ICD-10-CM

## 2014-02-17 DIAGNOSIS — N898 Other specified noninflammatory disorders of vagina: Secondary | ICD-10-CM

## 2014-02-17 LAB — OB RESULTS CONSOLE GC/CHLAMYDIA
CHLAMYDIA, DNA PROBE: NEGATIVE
GC PROBE AMP, GENITAL: NEGATIVE

## 2014-02-17 LAB — OB RESULTS CONSOLE GBS: STREP GROUP B AG: NEGATIVE

## 2014-02-17 MED ORDER — PRAMOXINE HCL 1 % RE FOAM: 1.0000 "application " | Freq: Three times a day (TID) | RECTAL | Status: DC | PRN

## 2014-02-17 NOTE — Progress Notes (Signed)
Hemorrhoids

## 2014-02-17 NOTE — Progress Notes (Signed)
Doing well; + hemorrhoids.  RX Proctofoam.  GBS and GC and chlamydia collected.

## 2014-02-18 LAB — GC/CHLAMYDIA PROBE AMP
CT PROBE, AMP APTIMA: NEGATIVE
GC PROBE AMP APTIMA: NEGATIVE

## 2014-02-19 ENCOUNTER — Encounter: Payer: Self-pay | Admitting: Family

## 2014-02-19 LAB — CULTURE, BETA STREP (GROUP B ONLY)

## 2014-02-23 ENCOUNTER — Ambulatory Visit (INDEPENDENT_AMBULATORY_CARE_PROVIDER_SITE_OTHER): Payer: Managed Care, Other (non HMO) | Admitting: Advanced Practice Midwife

## 2014-02-23 VITALS — BP 113/64 | HR 76 | Wt 187.0 lb

## 2014-02-23 DIAGNOSIS — Z3493 Encounter for supervision of normal pregnancy, unspecified, third trimester: Secondary | ICD-10-CM

## 2014-02-23 NOTE — Progress Notes (Signed)
Doing well.  Good fetal movement, denies vaginal bleeding, LOF, regular contractions.  Some back pain, intermittent cramping.    Reports episode of swelling in ankles feet 2 days ago, lasted 24 hours, painful but resolved.  No h/a, epigastric pain, visual disturbances.  No changes in diet or activities prior to swelling.  No cause for alarm since resolved.  Warning signs given, reasons to come to [facility].  Signs of labor reviewed.  Desires cervical exam.

## 2014-02-23 NOTE — Progress Notes (Signed)
Increase in lower extremities swelling-Painful

## 2014-03-03 ENCOUNTER — Ambulatory Visit (INDEPENDENT_AMBULATORY_CARE_PROVIDER_SITE_OTHER): Payer: Managed Care, Other (non HMO) | Admitting: Advanced Practice Midwife

## 2014-03-03 VITALS — BP 118/64 | HR 79 | Wt 189.0 lb

## 2014-03-03 DIAGNOSIS — Z3493 Encounter for supervision of normal pregnancy, unspecified, third trimester: Secondary | ICD-10-CM

## 2014-03-03 NOTE — Progress Notes (Signed)
Doing well.  Good fetal movement, denies vaginal bleeding, LOF, regular contractions.  Desires cervical exam.  Reviewed signs of labor, reasons to go to hospital.

## 2014-03-03 NOTE — Progress Notes (Signed)
Pt wants cervical check today

## 2014-03-10 ENCOUNTER — Ambulatory Visit (INDEPENDENT_AMBULATORY_CARE_PROVIDER_SITE_OTHER): Payer: Managed Care, Other (non HMO) | Admitting: Advanced Practice Midwife

## 2014-03-10 VITALS — BP 114/63 | HR 84 | Wt 188.0 lb

## 2014-03-10 DIAGNOSIS — Z3493 Encounter for supervision of normal pregnancy, unspecified, third trimester: Secondary | ICD-10-CM

## 2014-03-10 NOTE — Patient Instructions (Signed)

## 2014-03-10 NOTE — Progress Notes (Signed)
Ready!  Membranes swept per pt request. Labor precautions

## 2014-03-13 ENCOUNTER — Encounter: Payer: Self-pay | Admitting: Family

## 2014-03-16 ENCOUNTER — Ambulatory Visit (INDEPENDENT_AMBULATORY_CARE_PROVIDER_SITE_OTHER): Payer: Managed Care, Other (non HMO) | Admitting: Obstetrics & Gynecology

## 2014-03-16 VITALS — BP 128/72 | HR 79 | Wt 186.0 lb

## 2014-03-16 DIAGNOSIS — O2243 Hemorrhoids in pregnancy, third trimester: Secondary | ICD-10-CM

## 2014-03-16 DIAGNOSIS — Z3483 Encounter for supervision of other normal pregnancy, third trimester: Secondary | ICD-10-CM

## 2014-03-16 DIAGNOSIS — Z30011 Encounter for initial prescription of contraceptive pills: Secondary | ICD-10-CM

## 2014-03-16 DIAGNOSIS — O480 Post-term pregnancy: Secondary | ICD-10-CM

## 2014-03-16 MED ORDER — NORETHINDRONE-ETH ESTRADIOL 0.5-35 MG-MCG PO TABS: 1.0000 | ORAL_TABLET | Freq: Every day | ORAL | Status: DC

## 2014-03-16 NOTE — Progress Notes (Signed)
Procto-foam too expensive would like Anusol.  Induction scheduled for 03/20/14 @ 7:30am

## 2014-03-16 NOTE — Progress Notes (Signed)
NST reactive.  Pt for induction Monday.  Pitocin/AROM.

## 2014-03-17 ENCOUNTER — Encounter (HOSPITAL_COMMUNITY): Payer: Self-pay | Admitting: *Deleted

## 2014-03-17 ENCOUNTER — Inpatient Hospital Stay (HOSPITAL_COMMUNITY)
Admission: AD | Admit: 2014-03-17 | Discharge: 2014-03-18 | DRG: 775 | Disposition: A | Discharge status: Home / Self Care | Payer: Managed Care, Other (non HMO) | Source: Ambulatory Visit | Attending: Obstetrics and Gynecology | Admitting: Obstetrics and Gynecology

## 2014-03-17 DIAGNOSIS — Z3A40 40 weeks gestation of pregnancy: Secondary | ICD-10-CM | POA: Diagnosis present

## 2014-03-17 DIAGNOSIS — O471 False labor at or after 37 completed weeks of gestation: Secondary | ICD-10-CM | POA: Diagnosis present

## 2014-03-17 DIAGNOSIS — Z833 Family history of diabetes mellitus: Secondary | ICD-10-CM | POA: Diagnosis not present

## 2014-03-17 DIAGNOSIS — Z3483 Encounter for supervision of other normal pregnancy, third trimester: Secondary | ICD-10-CM

## 2014-03-17 DIAGNOSIS — Z87891 Personal history of nicotine dependence: Secondary | ICD-10-CM

## 2014-03-17 DIAGNOSIS — Z8249 Family history of ischemic heart disease and other diseases of the circulatory system: Secondary | ICD-10-CM | POA: Diagnosis not present

## 2014-03-17 DIAGNOSIS — IMO0001 Reserved for inherently not codable concepts without codable children: Secondary | ICD-10-CM

## 2014-03-17 LAB — TYPE AND SCREEN
ABO/RH(D): AB NEG
Antibody Screen: NEGATIVE

## 2014-03-17 LAB — RPR

## 2014-03-17 LAB — CBC
HCT: 32.1 % — ABNORMAL LOW (ref 36.0–46.0)
Hemoglobin: 10 g/dL — ABNORMAL LOW (ref 12.0–15.0)
MCH: 23.6 pg — ABNORMAL LOW (ref 26.0–34.0)
MCHC: 31.2 g/dL (ref 30.0–36.0)
MCV: 75.7 fL — AB (ref 78.0–100.0)
PLATELETS: 284 10*3/uL (ref 150–400)
RBC: 4.24 MIL/uL (ref 3.87–5.11)
RDW: 15.4 % (ref 11.5–15.5)
WBC: 12.6 10*3/uL — ABNORMAL HIGH (ref 4.0–10.5)

## 2014-03-17 LAB — ABO/RH: ABO/RH(D): AB NEG

## 2014-03-17 LAB — HIV ANTIBODY (ROUTINE TESTING W REFLEX): HIV: NONREACTIVE

## 2014-03-17 MED ORDER — SIMETHICONE 80 MG PO CHEW: 80.0000 mg | CHEWABLE_TABLET | ORAL | Status: DC | PRN

## 2014-03-17 MED ORDER — LACTATED RINGERS IV SOLN: 500.0000 mL | INTRAVENOUS | Status: DC | PRN

## 2014-03-17 MED ORDER — LANOLIN HYDROUS EX OINT: TOPICAL_OINTMENT | CUTANEOUS | Status: DC | PRN

## 2014-03-17 MED ORDER — OXYCODONE-ACETAMINOPHEN 5-325 MG PO TABS: 2.0000 | ORAL_TABLET | ORAL | Status: DC | PRN

## 2014-03-17 MED ORDER — PRENATAL MULTIVITAMIN CH
1.0000 | ORAL_TABLET | Freq: Every day | ORAL | Status: DC
  Administered 2014-03-17 – 2014-03-18 (×2): 1 via ORAL
  Filled 2014-03-17 (×2): qty 1

## 2014-03-17 MED ORDER — BENZOCAINE-MENTHOL 20-0.5 % EX AERO
1.0000 "application " | INHALATION_SPRAY | CUTANEOUS | Status: DC | PRN
  Filled 2014-03-17: qty 56

## 2014-03-17 MED ORDER — OXYTOCIN 40 UNITS IN LACTATED RINGERS INFUSION - SIMPLE MED
62.5000 mL/h | INTRAVENOUS | Status: DC
  Administered 2014-03-17: 62.5 mL/h via INTRAVENOUS
  Filled 2014-03-17: qty 1000

## 2014-03-17 MED ORDER — SENNOSIDES-DOCUSATE SODIUM 8.6-50 MG PO TABS
2.0000 | ORAL_TABLET | ORAL | Status: DC
  Administered 2014-03-18: 2 via ORAL
  Filled 2014-03-17: qty 2

## 2014-03-17 MED ORDER — ONDANSETRON HCL 4 MG/2ML IJ SOLN: 4.0000 mg | INTRAMUSCULAR | Status: DC | PRN

## 2014-03-17 MED ORDER — PRAMOXINE HCL 1 % RE FOAM: 1.0000 "application " | Freq: Three times a day (TID) | RECTAL | Status: DC | PRN

## 2014-03-17 MED ORDER — CITRIC ACID-SODIUM CITRATE 334-500 MG/5ML PO SOLN: 30.0000 mL | ORAL | Status: DC | PRN

## 2014-03-17 MED ORDER — DIBUCAINE 1 % RE OINT: 1.0000 "application " | TOPICAL_OINTMENT | RECTAL | Status: DC | PRN

## 2014-03-17 MED ORDER — ONDANSETRON HCL 4 MG PO TABS: 4.0000 mg | ORAL_TABLET | ORAL | Status: DC | PRN

## 2014-03-17 MED ORDER — LACTATED RINGERS IV SOLN
INTRAVENOUS | Status: DC
  Administered 2014-03-17: 05:00:00 via INTRAVENOUS

## 2014-03-17 MED ORDER — ACETAMINOPHEN 325 MG PO TABS: 650.0000 mg | ORAL_TABLET | ORAL | Status: DC | PRN

## 2014-03-17 MED ORDER — OXYTOCIN BOLUS FROM INFUSION
500.0000 mL | INTRAVENOUS | Status: DC
  Administered 2014-03-17: 500 mL via INTRAVENOUS

## 2014-03-17 MED ORDER — HYDROCORTISONE ACE-PRAMOXINE 1-1 % RE FOAM
1.0000 | Freq: Three times a day (TID) | RECTAL | Status: DC | PRN
  Filled 2014-03-17: qty 10

## 2014-03-17 MED ORDER — DIPHENHYDRAMINE HCL 25 MG PO CAPS: 25.0000 mg | ORAL_CAPSULE | Freq: Four times a day (QID) | ORAL | Status: DC | PRN

## 2014-03-17 MED ORDER — ZOLPIDEM TARTRATE 5 MG PO TABS
5.0000 mg | ORAL_TABLET | Freq: Every evening | ORAL | Status: DC | PRN
Start: 2014-03-17 — End: 2014-03-18

## 2014-03-17 MED ORDER — TETANUS-DIPHTH-ACELL PERTUSSIS 5-2.5-18.5 LF-MCG/0.5 IM SUSP: 0.5000 mL | Freq: Once | INTRAMUSCULAR | Status: DC

## 2014-03-17 MED ORDER — ONDANSETRON HCL 4 MG/2ML IJ SOLN
4.0000 mg | Freq: Four times a day (QID) | INTRAMUSCULAR | Status: DC | PRN
Start: 2014-03-17 — End: 2014-03-17

## 2014-03-17 MED ORDER — LIDOCAINE HCL (PF) 1 % IJ SOLN
30.0000 mL | INTRAMUSCULAR | Status: DC | PRN
  Filled 2014-03-17: qty 30

## 2014-03-17 MED ORDER — OXYCODONE-ACETAMINOPHEN 5-325 MG PO TABS: 1.0000 | ORAL_TABLET | ORAL | Status: DC | PRN

## 2014-03-17 MED ORDER — INFLUENZA VAC SPLIT QUAD 0.5 ML IM SUSY: 0.5000 mL | PREFILLED_SYRINGE | Freq: Once | INTRAMUSCULAR | Status: DC

## 2014-03-17 MED ORDER — WITCH HAZEL-GLYCERIN EX PADS: 1.0000 "application " | MEDICATED_PAD | CUTANEOUS | Status: DC | PRN

## 2014-03-17 MED ORDER — IBUPROFEN 600 MG PO TABS
600.0000 mg | ORAL_TABLET | Freq: Four times a day (QID) | ORAL | Status: DC
  Administered 2014-03-17 – 2014-03-18 (×5): 600 mg via ORAL
  Filled 2014-03-17 (×5): qty 1

## 2014-03-17 NOTE — Plan of Care (Signed)
Problem: Phase II Progression Outcomes Goal: Fetal monitoring per orders Outcome: Completed/Met Date Met:  03/17/14

## 2014-03-17 NOTE — Plan of Care (Signed)
Problem: Consults Goal: Postpartum Patient Education (See Patient Education module for education specifics.)  Outcome: Completed/Met Date Met:  03/17/14  Problem: Phase I Progression Outcomes Goal: Pain controlled with appropriate interventions Outcome: Completed/Met Date Met:  03/17/14 Goal: Voiding adequately Outcome: Completed/Met Date Met:  03/17/14 Goal: OOB as tolerated unless otherwise ordered Outcome: Completed/Met Date Met:  03/17/14 Goal: VS, stable, temp < 100.4 degrees F Outcome: Completed/Met Date Met:  03/17/14 Goal: Initial discharge plan identified Outcome: Completed/Met Date Met:  03/17/14 Goal: Other Phase I Outcomes/Goals Outcome: Completed/Met Date Met:  03/17/14  Problem: Phase II Progression Outcomes Goal: Pain controlled on oral analgesia Outcome: Completed/Met Date Met:  03/17/14 Goal: Progress activity as tolerated unless otherwise ordered Outcome: Completed/Met Date Met:  03/17/14 Goal: Afebrile, VS remain stable Outcome: Completed/Met Date Met:  03/17/14 Goal: Incision intact & without signs/symptoms of infection Outcome: Not Applicable Date Met:  44/51/46

## 2014-03-17 NOTE — H&P (Signed)
Deborah Edwards is a 33 y.o. female presenting for active labor.  Started getting intense an hour ago.  Maternal Medical History:  Reason for admission: Contractions.  Nausea.  Contractions: Onset was 1-2 hours ago.   Frequency: regular.   Perceived severity is strong.    Fetal activity: Perceived fetal activity is normal.   Last perceived fetal movement was within the past hour.    Prenatal complications: No bleeding or preterm labor.   Prenatal Complications - Diabetes: none.    OB History    Gravida Para Term Preterm AB TAB SAB Ectopic Multiple Living   5 4 4       4      Past Medical History  Diagnosis Date  . Irregular heart beat   . Abnormal Pap smear     colpo ok; 2011   Past Surgical History  Procedure Laterality Date  . Wisdom tooth extraction     Family History: family history includes Diabetes in her maternal grandmother; Heart failure in her father. There is no history of Cancer. Social History:  reports that she quit smoking about 12 years ago. Her smoking use included Cigarettes. She has a 1.5 pack-year smoking history. She does not have any smokeless tobacco history on file. She reports that she drinks alcohol. She reports that she does not use illicit drugs.  Review of Systems  Constitutional: Positive for diaphoresis. Negative for fever, chills and malaise/fatigue.  Gastrointestinal: Positive for abdominal pain. Negative for nausea, vomiting, diarrhea and constipation.    Dilation: 10 Effacement (%): 100 Exam by:: Carmelia Roller CNM Blood pressure 119/90, pulse 87, temperature 98.7 F (37.1 C), temperature source Oral, resp. rate 18, height 5\' 9"  (1.753 m), weight 186 lb (84.369 kg), last menstrual period 06/07/2013, not currently breastfeeding. Maternal Exam:  Uterine Assessment: Contraction strength is firm.  Contraction frequency is regular.   Abdomen: Fundal height is 39.   Estimated fetal weight is 8.   Fetal presentation: vertex  Introitus:  Normal vulva. Vagina is positive for vaginal discharge.  Ferning test: not done.  Nitrazine test: not done. Amniotic fluid character: not assessed.  Pelvis: adequate for delivery.   Cervix: Cervix evaluated by digital exam.     Fetal Exam Fetal Monitor Review: Mode: ultrasound.   Baseline rate: 135.  Variability: moderate (6-25 bpm).   Pattern: accelerations present and no decelerations.    Fetal State Assessment: Category I - tracings are normal.     Physical Exam  Constitutional: She is oriented to person, place, and time. She appears well-developed and well-nourished. No distress.  HENT:  Head: Normocephalic.  Cardiovascular: Normal rate.   Respiratory: Effort normal.  GI: Soft. She exhibits no distension. There is no tenderness. There is no rebound and no guarding.  Genitourinary: Vaginal discharge found.  Musculoskeletal: Normal range of motion.  Neurological: She is alert and oriented to person, place, and time.  Skin: Skin is warm and dry.  Psychiatric: She has a normal mood and affect.    Cervix 8cm/90/-2/vertex  Prenatal labs: ABO, Rh: AB/NEG/-- (03/30 1012) Antibody: NEG (08/10 1039) Rubella: 2.38 (03/30 1012) RPR: NON REAC (08/10 1038)  HBsAg: NEGATIVE (03/30 1012)  HIV: NON REACTIVE (03/30 1012)  GBS: Negative (10/09 0000)   Assessment/Plan: A:  SIUP at [redacted]w[redacted]d        Active labor  P:  Admit to [facility]       Routine orders      Anticipate SVD  [facility] 03/17/2014, 7:04 AM

## 2014-03-17 NOTE — Lactation Note (Signed)
This note was copied from the chart of Ethel. Lactation Consultation Note  Patient Name: Deborah Edwards [id number] Date: 03/17/2014 Reason for consult: Initial assessment   Initial visit; GA 40.3; BW 9 lbs, 5.4 oz; Infant has breastfed x6 (15-45) since birth (76 hrs old); voids-0; stools-2.  Infant asleep in crib but showing early feeding cues.  LC pointed out feeding cues.  Mom stated he has breastfed "better than the others" (experienced x4 children) and has no c/o with breastfeeding.  Denies any soreness.  Educated on size of infant's stomach, cluster feeding, and encouraged to feed with early feeding cues.  Mom anticipates discharge tomorrow.  Mom has a DEBP at home.  Lactation brochure given and informed of outpatient services and hospital support group.  Encouraged to call for assistance as needed but LC would see her as needed (PRN) since she is doing so well.     Maternal Data Formula Feeding for Exclusion: No Does the patient have breastfeeding experience prior to this delivery?: Yes  Feeding Feeding Type: Breast Fed Length of feed: 40 min  LATCH Score/Interventions                      Lactation Tools Discussed/Used WIC Program: No   Consult Status Consult Status: PRN    Merlene Laughter 03/17/2014, 6:40 PM

## 2014-03-18 MED ORDER — IBUPROFEN 600 MG PO TABS: 600.0000 mg | ORAL_TABLET | Freq: Four times a day (QID) | ORAL | Status: DC | PRN

## 2014-03-18 MED ORDER — NORETHINDRONE 0.35 MG PO TABS: 1.0000 | ORAL_TABLET | Freq: Every day | ORAL | Status: DC

## 2014-03-18 MED ORDER — RHO D IMMUNE GLOBULIN 1500 UNIT/2ML IJ SOSY
300.0000 ug | PREFILLED_SYRINGE | Freq: Once | INTRAMUSCULAR | Status: AC
Start: 2014-03-18 — End: 2014-03-18
  Administered 2014-03-18: 300 ug via INTRAMUSCULAR
  Filled 2014-03-18: qty 2

## 2014-03-18 NOTE — Plan of Care (Signed)
Problem: Discharge Progression Outcomes Goal: Tolerating diet Outcome: Completed/Met Date Met:  03/18/14     

## 2014-03-18 NOTE — Plan of Care (Signed)
Problem: Discharge Progression Outcomes Goal: Complications resolved/controlled Outcome: Completed/Met Date Met:  03/18/14

## 2014-03-18 NOTE — Plan of Care (Signed)
Problem: Discharge Progression Outcomes Goal: Activity appropriate for discharge plan Outcome: Completed/Met Date Met:  03/18/14     

## 2014-03-18 NOTE — Plan of Care (Signed)
Problem: Discharge Progression Outcomes Goal: Other Discharge Outcomes/Goals Outcome: Not Applicable Date Met:  40/01/80

## 2014-03-18 NOTE — Plan of Care (Signed)
Problem: Discharge Progression Outcomes Goal: Discharge plan in place and appropriate Outcome: Completed/Met Date Met:  03/18/14     

## 2014-03-18 NOTE — Plan of Care (Signed)
Problem: Discharge Progression Outcomes Goal: Afebrile, VS remain stable at discharge Outcome: Completed/Met Date Met:  03/18/14

## 2014-03-18 NOTE — Plan of Care (Signed)
Problem: Phase II Progression Outcomes Goal: Tolerating diet Outcome: Completed/Met Date Met:  03/18/14

## 2014-03-18 NOTE — Plan of Care (Signed)
Problem: Phase II Progression Outcomes Goal: Other Phase II Outcomes/Goals Outcome: Completed/Met Date Met:  03/18/14

## 2014-03-18 NOTE — Lactation Note (Signed)
This note was copied from the chart of [place]. Lactation Consultation Note: Follow up visit with this experienced BF mom. She reports that baby has been feeding a lot through the night Reassurance given. Reports nipples are tender but not cracked. Reports a little pain at the beginning of the feeding but eases off after a few minutes. RN has given comfort gels and they help a lot. No questions at present. To call prn  Patient Name: Deborah Edwards CEYEM'V Date: 03/18/2014 Reason for consult: Follow-up assessment   Maternal Data Formula Feeding for Exclusion: No Has patient been taught Hand Expression?: Yes Does the patient have breastfeeding experience prior to this delivery?: Yes  Feeding Feeding Type: Breast Fed  LATCH Score/Interventions                      Lactation Tools Discussed/Used     Consult Status Consult Status: Complete    Truddie Crumble 03/18/2014, 9:26 AM

## 2014-03-18 NOTE — Plan of Care (Signed)
Problem: Phase II Progression Outcomes Goal: Rh isoimmunization per orders Outcome: Completed/Met Date Met:  03/18/14

## 2014-03-18 NOTE — Plan of Care (Signed)
Problem: Discharge Progression Outcomes Goal: Pain controlled with appropriate interventions Outcome: Completed/Met Date Met:  03/18/14     

## 2014-03-18 NOTE — Plan of Care (Signed)
Problem: Discharge Progression Outcomes Goal: Barriers To Progression Addressed/Resolved Outcome: Completed/Met Date Met:  03/18/14     

## 2014-03-18 NOTE — Discharge Instructions (Addendum)
Intrauterine Device Information An intrauterine device (IUD) is inserted into your uterus to prevent pregnancy. There are two types of IUDs available:   Copper IUD--This type of IUD is wrapped in copper wire and is placed inside the uterus. Copper makes the uterus and fallopian tubes produce a fluid that kills sperm. The copper IUD can stay in place for 10 years.  Hormone IUD--This type of IUD contains the hormone progestin (synthetic progesterone). The hormone thickens the cervical mucus and prevents sperm from entering the uterus. It also thins the uterine lining to prevent implantation of a fertilized egg. The hormone can weaken or kill the sperm that get into the uterus. One type of hormone IUD can stay in place for 5 years, and another type can stay in place for 3 years. Your health care provider will make sure you are a good candidate for a contraceptive IUD. Discuss with your health care provider the possible side effects.  ADVANTAGES OF AN INTRAUTERINE DEVICE  IUDs are highly effective, reversible, long acting, and low maintenance.   There are no estrogen-related side effects.   An IUD can be used when breastfeeding.   IUDs are not associated with weight gain.   The copper IUD works immediately after insertion.   The hormone IUD works right away if inserted within 7 days of your period starting. You will need to use a backup method of birth control for 7 days if the hormone IUD is inserted at any other time in your cycle.  The copper IUD does not interfere with your female hormones.   The hormone IUD can make heavy menstrual periods lighter and decrease cramping.   The hormone IUD can be used for 3 or 5 years.   The copper IUD can be used for 10 years. DISADVANTAGES OF AN INTRAUTERINE DEVICE  The hormone IUD can be associated with irregular bleeding patterns.   The copper IUD can make your menstrual flow heavier and more painful.   You may experience cramping and  vaginal bleeding after insertion.  Document Released: 04/01/2004 Document Revised: 12/29/2012 Document Reviewed: 10/17/2012 [facility] Patient Information 2015 [place], [place]. This information is not intended to replace advice given to you by your health care provider. Make sure you discuss any questions you have with your health care provider. Contraceptive Implant Information A contraceptive implant is a plastic rod that is inserted under your skin. It is usually inserted under the skin of your upper arm. It continually releases small amounts of progestin (synthetic progesterone) into your bloodstream. This prevents an egg from being released from your ovaries. It also thickens your cervical mucus to prevent sperm from entering the cervix, and it thins your uterine lining to prevent a fertilized egg from attaching to your uterus. Contraceptive implants can be effective for up to 3 years. They do not provide protection against sexually transmitted diseases (STDs).  The procedure to insert an implant usually takes about 10 minutes. There may be minor bruising, swelling, and discomfort at the insertion site for a couple days. The implant begins to work within the first day. Other contraceptive protection may be necessary for 7 days. Be sure to discuss with your health care provider if you need a backup method of contraception.  Your health care provider will make sure you are a good candidate for the contraceptive implant. Discuss with your health care provider the possible side effects of the implant. ADVANTAGES  It prevents pregnancy for up to 3 years.  It is easily reversible.  It is convenient.  It can be used when breastfeeding.  It can be used by women who cannot take estrogen. DISADVANTAGES  You may have irregular or unplanned vaginal bleeding.  You may develop side effects, including headache, weight gain, acne, breast tenderness, or mood changes.  You may have tissue or nerve damage  after insertion (rare).  It may be difficult and uncomfortable to remove.  Certain medicines may interfere with the effectiveness of the implant. REMOVAL OF IMPLANT The implant should be removed in 3 years or as directed by your health care provider. The implant's effect wears off in a few hours after removal. Your ability to get pregnant (fertility) may be restored in 1-2 weeks. A new implant can be inserted as soon as the old one is removed if desired. CONTRAINDICATIONS You should not get the implant if you are experiencing any of the following situations:  You are pregnant.  You have a history of breast cancer, osteoporosis, blood clots, heart disease, diabetes, high blood pressure, liver disease, tumors, or stroke.   You have undiagnosed vaginal bleeding.  You have a sensitivity to any part of the implant. Document Released: 04/17/2011 Document Revised: 12/29/2012 Document Reviewed: 10/25/2012 [facility] Patient Information 2015 [place], [place]. This information is not intended to replace advice given to you by your health care provider. Make sure you discuss any questions you have with your health care provider.   Contraceptive Implant Information A contraceptive implant is a plastic rod that is inserted under your skin. It is usually inserted under the skin of your upper arm. It continually releases small amounts of progestin (synthetic progesterone) into your bloodstream. This prevents an egg from being released from your ovaries. It also thickens your cervical mucus to prevent sperm from entering the cervix, and it thins your uterine lining to prevent a fertilized egg from attaching to your uterus. Contraceptive implants can be effective for up to 3 years. They do not provide protection against sexually transmitted diseases (STDs).  The procedure to insert an implant usually takes about 10 minutes. There may be minor bruising, swelling, and discomfort at the insertion site for a couple  days. The implant begins to work within the first day. Other contraceptive protection may be necessary for 7 days. Be sure to discuss with your health care provider if you need a backup method of contraception.  Your health care provider will make sure you are a good candidate for the contraceptive implant. Discuss with your health care provider the possible side effects of the implant. ADVANTAGES  It prevents pregnancy for up to 3 years.  It is easily reversible.  It is convenient.  It can be used when breastfeeding.  It can be used by women who cannot take estrogen. DISADVANTAGES  You may have irregular or unplanned vaginal bleeding.  You may develop side effects, including headache, weight gain, acne, breast tenderness, or mood changes.  You may have tissue or nerve damage after insertion (rare).  It may be difficult and uncomfortable to remove.  Certain medicines may interfere with the effectiveness of the implant. REMOVAL OF IMPLANT The implant should be removed in 3 years or as directed by your health care provider. The implant's effect wears off in a few hours after removal. Your ability to get pregnant (fertility) may be restored in 1-2 weeks. A new implant can be inserted as soon as the old one is removed if desired. CONTRAINDICATIONS You should not get the implant if you are experiencing any of  the following situations:  You are pregnant.  You have a history of breast cancer, osteoporosis, blood clots, heart disease, diabetes, high blood pressure, liver disease, tumors, or stroke.   You have undiagnosed vaginal bleeding.  You have a sensitivity to any part of the implant. Document Released: 04/17/2011 Document Revised: 12/29/2012 Document Reviewed: 10/25/2012 [facility] Patient Information 2015 [place], [place]. This information is not intended to replace advice given to you by your health care provider. Make sure you discuss any questions you have with your health care  provider.  Contraceptive Injection Information Contraceptive injections protect against pregnancy. Progesterone-only injections are given every 3 months to prevent pregnancy. These injections contain synthetic progesterone hormone. This synthetic progesterone hormone stops the ovaries from releasing eggs. It also thickens cervical mucus and changes the uterine lining. Your health care provider will make sure you are a good candidate for contraceptive injections. Discuss the possible side effects of the injection with your health care provider. ADVANTAGES  They are highly effective and reversible.  They slow down the flow of heavy menstrual periods.  They control cramps and painful menstrual periods.  Some women no longer get their period.  They are effective in preventing pregnancy when used correctly.  You are always protected from getting pregnant when you get the injection on time. DISADVANTAGES  They can be associated with weight gain and irregular bleeding.  They do not protect against sexually transmitted diseases (STDs).  You must visit your health care provider every 3 months.  The injections may be uncomfortable.  They may cost more than other methods of birth control.  It can take between 6 months and 2 years to be able to get pregnant (fertility).  They may also cause bone loss. Document Released: 04/17/2011 Document Revised: 12/29/2012 Document Reviewed: 10/26/2012 [facility] Patient Information 2015 [place], [place]. This information is not intended to replace advice given to you by your health care provider. Make sure you discuss any questions you have with your health care provider.

## 2014-03-18 NOTE — Discharge Summary (Signed)
Deborah Edwards is a [id number] female who was admitted in active labor on 03/17/14; shortly after admission she had a SVD at 8280 w no complications. Routine prenatal care at [facility] w no complications.   Obstetric Discharge Summary Reason for Admission: onset of labor Prenatal Procedures: none Intrapartum Procedures: spontaneous vaginal delivery Postpartum Procedures: Rho(D) Ig Complications-Operative and Postpartum: none HEMOGLOBIN  Date Value Ref Range Status  03/17/2014 10.0* 12.0 - 15.0 g/dL Final   HCT  Date Value Ref Range Status  03/17/2014 32.1* 36.0 - 46.0 % Final   Delivery Pushed well in variety of positions. Fetus felt OP to me. At 6:32 AM a viable and healthy female was delivered via Vaginal, Spontaneous Delivery (Presentation: Middle Occiput Posterior). APGAR:8 9, ; weight 9lbs 5oz. No difficulty with shoulders.  Placenta status: Spontaneous and grossly intact with 3 vessel cord. with the following complications: None.   Today  Patient is doing well today. Has no complaints. Ambulating wo difficulty. Voiding wo difficulty. Passed flatus. Pain well controlled. Breastfeeding successfully. Would like micronor as BC, but is also interested in LARC.   Physical Exam:  General: alert, cooperative and no distress Lochia: appropriate Uterine Fundus: firm DVT Evaluation: No evidence of DVT seen on physical exam.  Discharge Diagnoses: Term Pregnancy-delivered  Discharge Information: Date: 03/18/2014 Activity: pelvic rest Diet: routine Medications: Ibuprofen Condition: stable Instructions: refer to practice specific booklet Discharge to: home   Newborn Data: Live born female  Birth Weight: 9 lb 5.4 oz (4235 g) APGAR: 8, 9  Home with mother.  Collene Gobble 03/18/2014, 12:57 AM

## 2014-03-19 LAB — RH IG WORKUP (INCLUDES ABO/RH)
ABO/RH(D): AB NEG
FETAL SCREEN: NEGATIVE
Gestational Age(Wks): 40
UNIT DIVISION: 0

## 2014-03-20 ENCOUNTER — Inpatient Hospital Stay (HOSPITAL_COMMUNITY): Admission: RE | Admit: 2014-03-20 | Payer: Managed Care, Other (non HMO) | Source: Ambulatory Visit

## 2014-07-05 ENCOUNTER — Ambulatory Visit (INDEPENDENT_AMBULATORY_CARE_PROVIDER_SITE_OTHER): Payer: Managed Care, Other (non HMO) | Admitting: Obstetrics & Gynecology

## 2014-07-05 ENCOUNTER — Encounter: Payer: Self-pay | Admitting: Obstetrics & Gynecology

## 2014-07-05 VITALS — BP 106/68 | HR 67 | Resp 16 | Ht 67.0 in | Wt 151.0 lb

## 2014-07-05 DIAGNOSIS — N390 Urinary tract infection, site not specified: Secondary | ICD-10-CM

## 2014-07-05 LAB — POCT URINALYSIS DIPSTICK
Bilirubin, UA: NEGATIVE
Glucose, UA: NEGATIVE
Ketones, UA: NEGATIVE
NITRITE UA: POSITIVE
SPEC GRAV UA: 1.02
Urobilinogen, UA: NEGATIVE
pH, UA: 7

## 2014-07-05 MED ORDER — SULFAMETHOXAZOLE-TRIMETHOPRIM 800-160 MG PO TABS: 1.0000 | ORAL_TABLET | Freq: Two times a day (BID) | ORAL | Status: DC

## 2014-07-05 NOTE — Progress Notes (Signed)
   Subjective:    Patient ID: Deborah Edwards, female    DOB: 1980-11-19, 34 y.o.   MRN: [id number]  HPI Deborah Edwards is here with UTI symptoms. She is also interested in the [place]. She had unprotected IC about 10 days ago.   Review of Systems     Objective:   Physical Exam WNWHWFNAD Ambulating and breathing normally No CVAT      Assessment & Plan:  UTI- bactrim Contraception- RTC 1 week (no sex) for Mirena insertion

## 2014-07-08 LAB — CULTURE, URINE COMPREHENSIVE: Colony Count: >=100000

## 2014-07-12 ENCOUNTER — Ambulatory Visit (INDEPENDENT_AMBULATORY_CARE_PROVIDER_SITE_OTHER): Payer: Managed Care, Other (non HMO) | Admitting: Obstetrics & Gynecology

## 2014-07-12 ENCOUNTER — Encounter: Payer: Self-pay | Admitting: Obstetrics & Gynecology

## 2014-07-12 VITALS — BP 118/78 | HR 64 | Resp 16 | Ht 70.0 in | Wt 144.0 lb

## 2014-07-12 DIAGNOSIS — Z01812 Encounter for preprocedural laboratory examination: Secondary | ICD-10-CM

## 2014-07-12 DIAGNOSIS — Z30430 Encounter for insertion of intrauterine contraceptive device: Secondary | ICD-10-CM

## 2014-07-12 LAB — POCT URINE PREGNANCY: PREG TEST UR: NEGATIVE

## 2014-07-12 MED ORDER — LEVONORGESTREL 20 MCG/24HR IU IUD
1.0000 | INTRAUTERINE_SYSTEM | Freq: Once | INTRAUTERINE | Status: AC
  Administered 2014-07-12: 1 via INTRAUTERINE

## 2014-07-12 NOTE — Progress Notes (Signed)
   Subjective:    Patient ID: Deborah Edwards, female    DOB: December 12, 1980, 34 y.o.   MRN: [id number]  HPI  34 yo MW P5 is here for Mirena insertion. All questions were answered.  Review of Systems     Objective:   Physical Exam  UPT negative, consent signed, Time out procedure done. Cervix prepped with betadine and grasped with a single tooth tenaculum. Mirena was easily placed and the strings were cut to 3-4 cm. Uterus sounded to 9 cm. She tolerated the procedure well.        Assessment & Plan:   Contraception- Mirena RTC 4 weeks/prn sooner

## 2014-08-17 ENCOUNTER — Ambulatory Visit: Payer: Managed Care, Other (non HMO) | Admitting: Obstetrics & Gynecology

## 2015-02-19 ENCOUNTER — Encounter: Payer: Self-pay | Admitting: Family Medicine

## 2015-02-19 ENCOUNTER — Ambulatory Visit (INDEPENDENT_AMBULATORY_CARE_PROVIDER_SITE_OTHER): Payer: Managed Care, Other (non HMO) | Admitting: Family Medicine

## 2015-02-19 VITALS — BP 131/78 | HR 73 | Resp 16 | Ht 67.0 in | Wt 144.0 lb

## 2015-02-19 DIAGNOSIS — Z1151 Encounter for screening for human papillomavirus (HPV): Secondary | ICD-10-CM | POA: Diagnosis not present

## 2015-02-19 DIAGNOSIS — Z124 Encounter for screening for malignant neoplasm of cervix: Secondary | ICD-10-CM | POA: Diagnosis not present

## 2015-02-19 DIAGNOSIS — R87810 Cervical high risk human papillomavirus (HPV) DNA test positive: Secondary | ICD-10-CM | POA: Diagnosis not present

## 2015-02-19 DIAGNOSIS — R102 Pelvic and perineal pain: Secondary | ICD-10-CM

## 2015-02-19 DIAGNOSIS — A6000 Herpesviral infection of urogenital system, unspecified: Secondary | ICD-10-CM

## 2015-02-19 DIAGNOSIS — Z113 Encounter for screening for infections with a predominantly sexual mode of transmission: Secondary | ICD-10-CM | POA: Diagnosis not present

## 2015-02-19 LAB — POCT URINALYSIS DIPSTICK
Bilirubin, UA: NEGATIVE
GLUCOSE UA: NEGATIVE
Ketones, UA: NEGATIVE
LEUKOCYTES UA: NEGATIVE
NITRITE UA: NEGATIVE
Protein, UA: NEGATIVE
RBC UA: NEGATIVE
Spec Grav, UA: 1.02
UROBILINOGEN UA: NEGATIVE
pH, UA: 5

## 2015-02-19 MED ORDER — VALACYCLOVIR HCL 1 G PO TABS: 1000.0000 mg | ORAL_TABLET | Freq: Every day | ORAL | Status: DC

## 2015-02-19 MED ORDER — LIDOCAINE VISCOUS 2 % MT SOLN: 20.0000 mL | OROMUCOSAL | Status: DC | PRN

## 2015-02-19 NOTE — Assessment & Plan Note (Signed)
Needed pap testing 2/16--will obtain today.

## 2015-02-19 NOTE — Progress Notes (Signed)
    Subjective:    Patient ID: Deborah Edwards is a 34 y.o. female presenting with Vaginal Itching  on 02/19/2015  HPI: Reports vaginal itching and burning x 2 wks. Occurred after intercourse. Got worse initially and then came back after intercourse. No discharge. No cycle since baby was born. Using peri-bottle with salt water spray, which helps. No vaginal discharge. Reports new sexual partner.  Review of Systems  Constitutional: Negative for fever and chills.  Respiratory: Negative for shortness of breath.   Cardiovascular: Negative for chest pain.  Gastrointestinal: Negative for nausea, vomiting and abdominal pain.  Genitourinary: Negative for dysuria.  Skin: Negative for rash.      Objective:    BP 131/78 mmHg  Pulse 73  Resp 16  Ht 5\' 7"  (1.702 m)  Wt 144 lb (65.318 kg)  BMI 22.55 kg/m2  Breastfeeding? Yes Physical Exam  Constitutional: She is oriented to person, place, and time. She appears well-developed and well-nourished. No distress.  HENT:  Head: Normocephalic and atraumatic.  Eyes: No scleral icterus.  Neck: Neck supple.  Cardiovascular: Normal rate.   Pulmonary/Chest: Effort normal.  Abdominal: Soft.  Genitourinary: Vaginal discharge (thin, yellow) found.  Multiple small ulcerated areas noted at introitus  Neurological: She is alert and oriented to person, place, and time.  Skin: Skin is warm and dry.  Psychiatric: She has a normal mood and affect.        Assessment & Plan:   Problem List Items Addressed This Visit      Unprioritized   Cervical high risk HPV (human papillomavirus) test positive    Needed pap testing 2/16--will obtain today.      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 Full STD screen today--Valtrex and viscous Lidocaine given.    Relevant Medications    lidocaine (XYLOCAINE) 2 % solution    valACYclovir  (VALTREX) 1000 MG tablet    Other Relevant Orders    POCT Urinalysis Dipstick (Completed)    Wet prep, genital    GC/Chlamydia Probe Amp    Herpes simplex virus culture    Screen for STD (sexually transmitted disease)        Relevant Orders    RPR    HIV antibody    Hepatitis B surface antigen    Hepatitis C antibody       Return if symptoms worsen or fail to improve.  Deborah Edwards S 02/19/2015 1:39 PM

## 2015-02-19 NOTE — Patient Instructions (Signed)

## 2015-02-20 LAB — WET PREP, GENITAL
Clue Cells Wet Prep HPF POC: NONE SEEN
TRICH WET PREP: NONE SEEN
Yeast Wet Prep HPF POC: NONE SEEN

## 2015-02-20 LAB — HIV ANTIBODY (ROUTINE TESTING W REFLEX): HIV: NONREACTIVE

## 2015-02-20 LAB — HEPATITIS B SURFACE ANTIGEN: Hepatitis B Surface Ag: NEGATIVE

## 2015-02-20 LAB — RPR

## 2015-02-20 LAB — HEPATITIS C ANTIBODY: HCV Ab: NEGATIVE

## 2015-02-21 LAB — HERPES SIMPLEX VIRUS CULTURE: Organism ID, Bacteria: DETECTED

## 2015-02-22 ENCOUNTER — Telehealth: Payer: Self-pay | Admitting: *Deleted

## 2015-02-22 ENCOUNTER — Encounter: Payer: Self-pay | Admitting: Family Medicine

## 2015-02-22 DIAGNOSIS — A6000 Herpesviral infection of urogenital system, unspecified: Secondary | ICD-10-CM | POA: Insufficient documentation

## 2015-02-22 LAB — CYTOLOGY - PAP

## 2015-02-22 NOTE — Telephone Encounter (Signed)
Pt notified of positive HSV 1.  Dr Kennon Rounds had given her Valtrex for this outbreak.

## 2015-02-22 NOTE — Telephone Encounter (Signed)
Pt notified of ASCUS pap but neg HPV.  Repeat pap in 3 years

## 2015-02-23 ENCOUNTER — Telehealth: Payer: Self-pay | Admitting: *Deleted

## 2015-02-23 ENCOUNTER — Encounter: Payer: Self-pay | Admitting: *Deleted

## 2015-02-23 DIAGNOSIS — A749 Chlamydial infection, unspecified: Secondary | ICD-10-CM

## 2015-02-23 LAB — GC/CHLAMYDIA PROBE AMP
CT PROBE, AMP APTIMA: POSITIVE — AB
GC PROBE AMP APTIMA: NEGATIVE

## 2015-02-23 MED ORDER — AZITHROMYCIN 250 MG PO TABS: ORAL_TABLET | ORAL | Status: DC

## 2015-02-23 NOTE — Telephone Encounter (Signed)
Pt notified of positive Chlamydia culture and RX sent to her pharmacy.  Pt is aware that partner needs to also be treated.  Appt to be made for a TOC.

## 2015-03-27 ENCOUNTER — Other Ambulatory Visit (INDEPENDENT_AMBULATORY_CARE_PROVIDER_SITE_OTHER): Payer: Managed Care, Other (non HMO)

## 2015-03-27 DIAGNOSIS — A749 Chlamydial infection, unspecified: Secondary | ICD-10-CM | POA: Diagnosis not present

## 2015-03-27 DIAGNOSIS — Z113 Encounter for screening for infections with a predominantly sexual mode of transmission: Secondary | ICD-10-CM

## 2015-03-29 LAB — GC/CHLAMYDIA PROBE AMP (~~LOC~~) NOT AT ARMC
Chlamydia: NEGATIVE
NEISSERIA GONORRHEA: NEGATIVE

## 2015-03-30 ENCOUNTER — Telehealth: Payer: Self-pay | Admitting: *Deleted

## 2015-03-30 NOTE — Telephone Encounter (Signed)
Pt notified of neg GC/Chlamydia

## 2015-04-04 ENCOUNTER — Ambulatory Visit (INDEPENDENT_AMBULATORY_CARE_PROVIDER_SITE_OTHER): Payer: Managed Care, Other (non HMO)

## 2015-04-04 ENCOUNTER — Ambulatory Visit (INDEPENDENT_AMBULATORY_CARE_PROVIDER_SITE_OTHER): Payer: Managed Care, Other (non HMO) | Admitting: Obstetrics & Gynecology

## 2015-04-04 ENCOUNTER — Encounter: Payer: Self-pay | Admitting: Obstetrics & Gynecology

## 2015-04-04 VITALS — BP 118/77 | HR 71 | Resp 16 | Ht 67.0 in | Wt 148.0 lb

## 2015-04-04 DIAGNOSIS — N938 Other specified abnormal uterine and vaginal bleeding: Secondary | ICD-10-CM

## 2015-04-04 DIAGNOSIS — D261 Other benign neoplasm of corpus uteri: Secondary | ICD-10-CM

## 2015-04-04 DIAGNOSIS — Z30431 Encounter for routine checking of intrauterine contraceptive device: Secondary | ICD-10-CM | POA: Diagnosis not present

## 2015-04-04 DIAGNOSIS — R102 Pelvic and perineal pain: Secondary | ICD-10-CM

## 2015-04-04 IMAGING — US US TRANSVAGINAL NON-OB
1 series · 13 of 25 positions shown · non-contrast
Comparison: None

CLINICAL DATA: Dysfunctional uterine bleeding.

EXAM:
TRANSABDOMINAL AND TRANSVAGINAL ULTRASOUND OF PELVIS
TECHNIQUE: Both transabdominal and transvaginal ultrasound examinations of the
pelvis were performed. Transabdominal technique was performed for
global imaging of the pelvis including uterus, ovaries, adnexal
regions, and pelvic cul-de-sac. It was necessary to proceed with
endovaginal exam following the transabdominal exam to visualize the
uterus, endometrium and ovaries.

[Series 1: us transvaginal non-ob · 0.17mm/px · 13 of 95 slices shown]
[im 1/95]
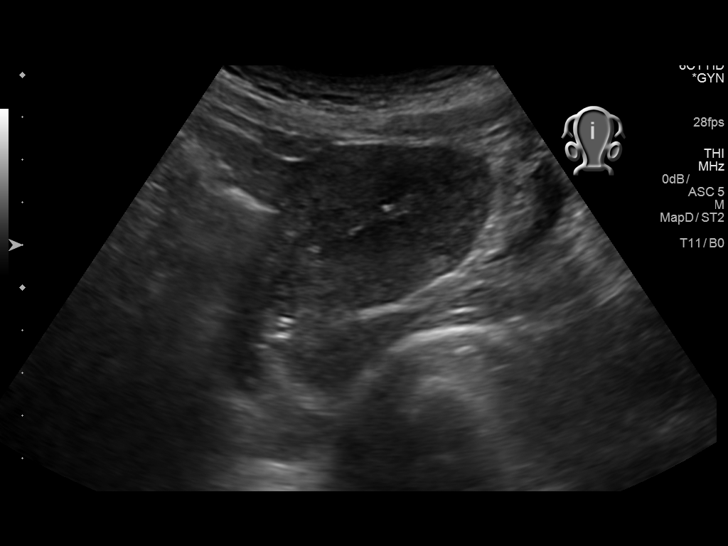
[im 8/95]
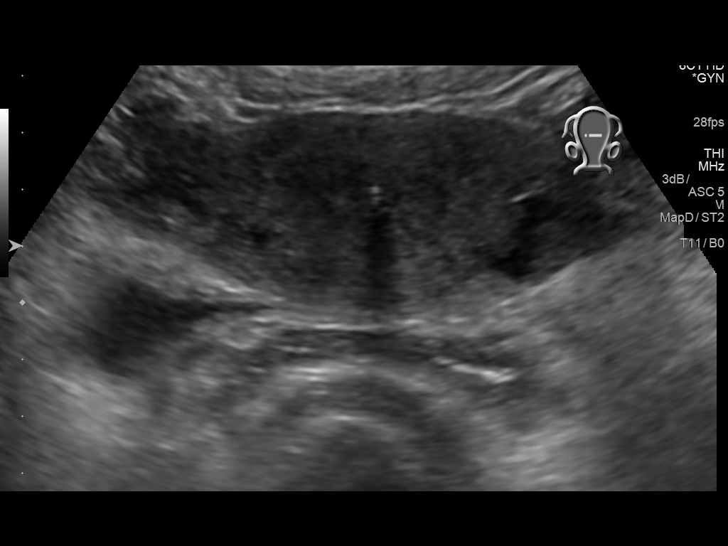
[im 16/95]
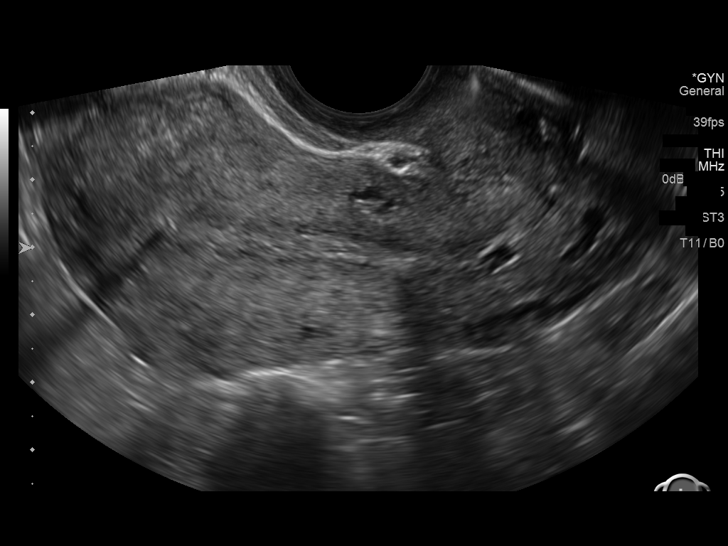
[im 24/95]
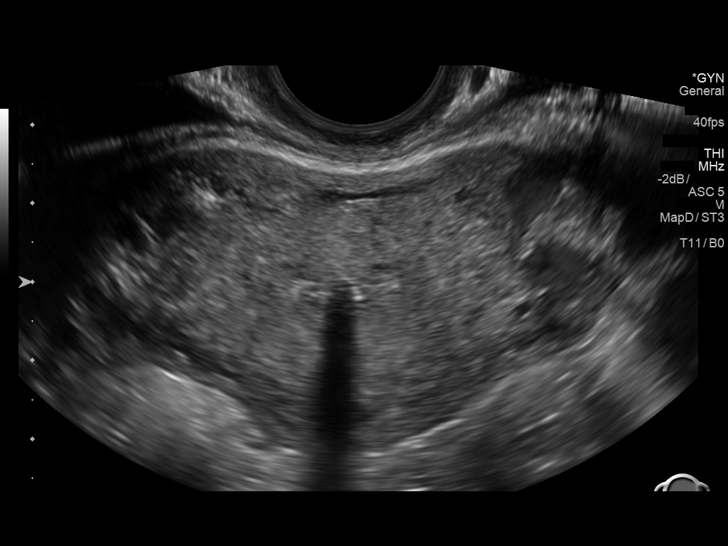
[im 32/95]
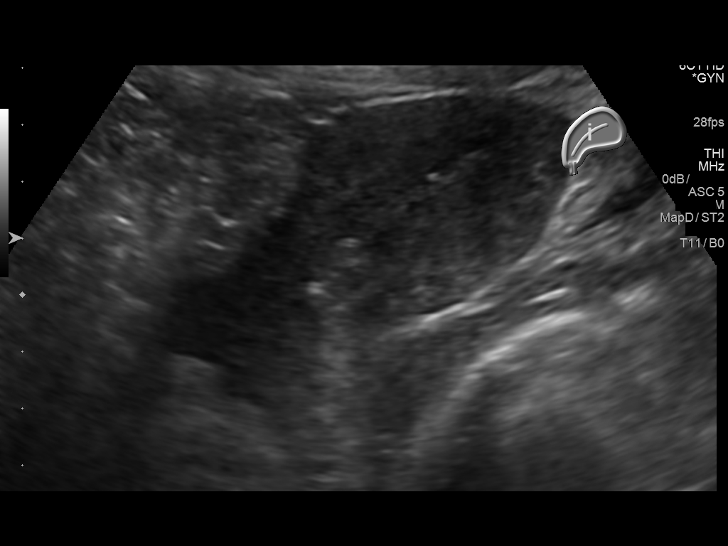
[im 40/95]
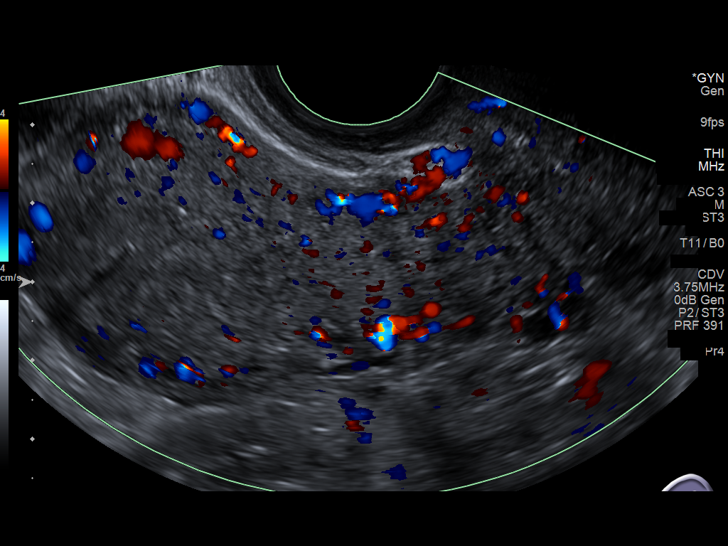
[im 48/95]
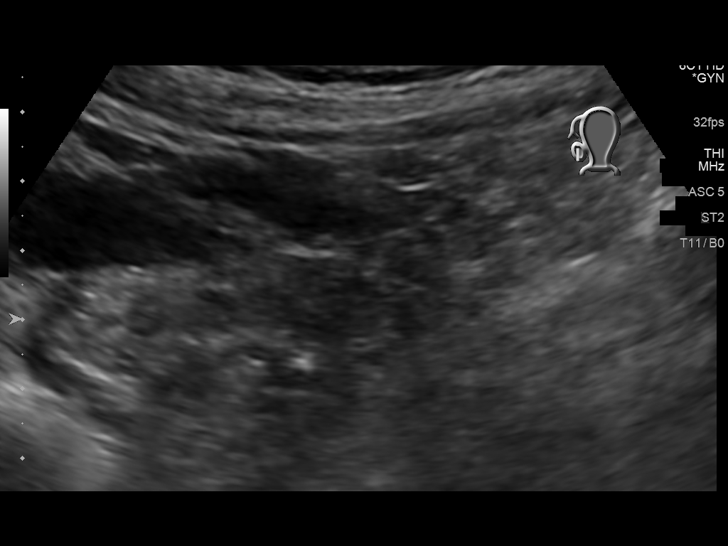
[im 55/95]
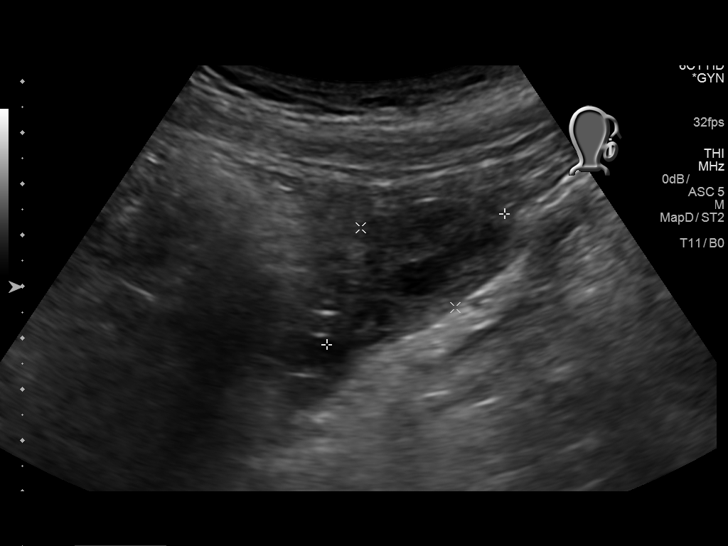
[im 63/95]
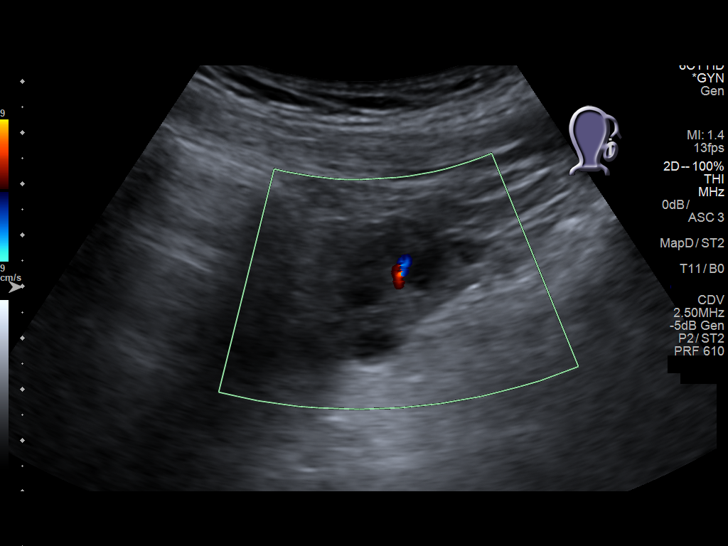
[im 71/95]
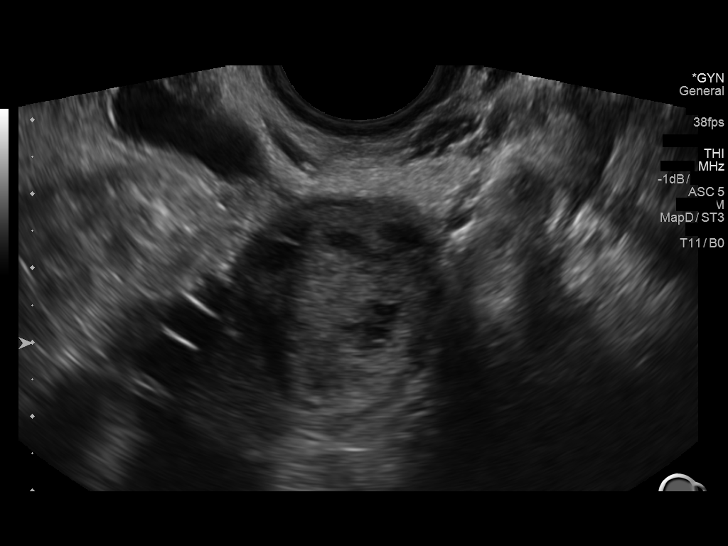
[im 79/95]
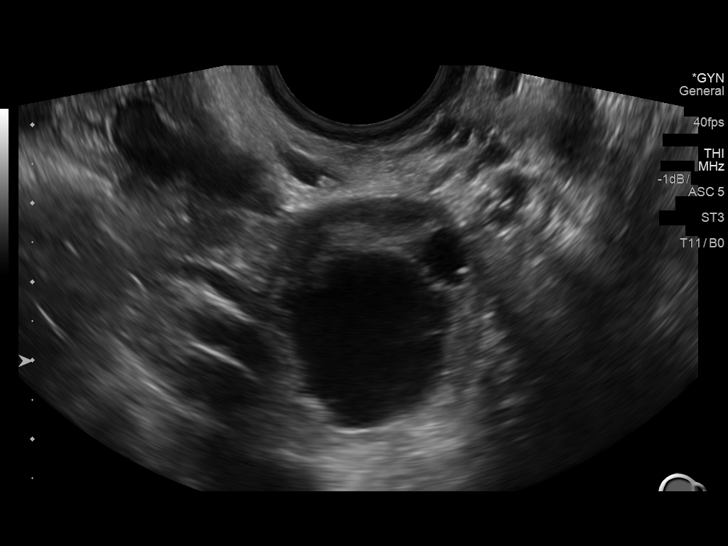
[im 87/95]
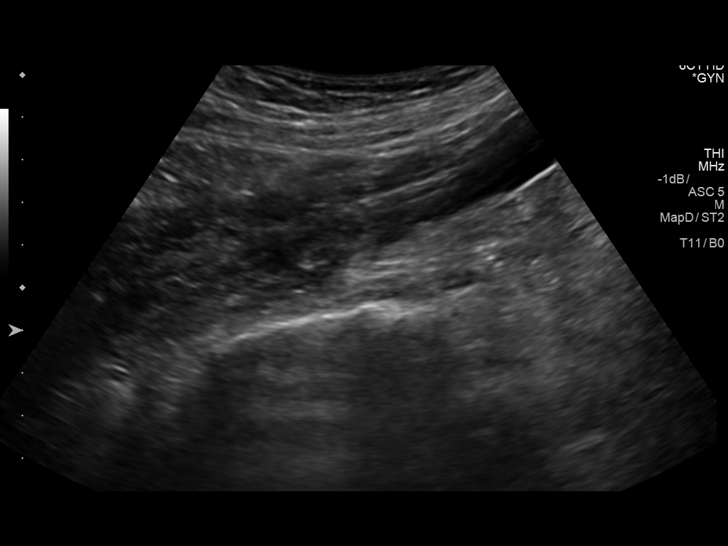
[im 95/95]
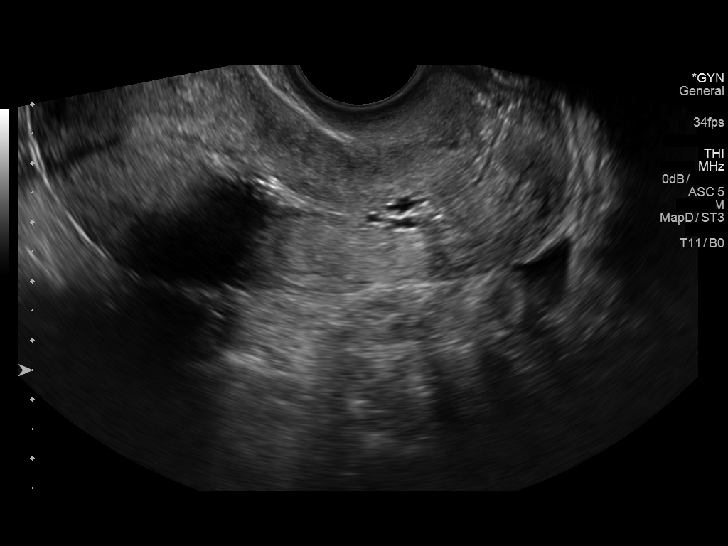

[13 of 25 positions shown; findings below may reference images not displayed]

FINDINGS: Uterus

Measurements: 8.9 x 4.4 x 5.5 cm. Anterior subserosal fibroid is
noted measuring 2.2 x 1.6 x 2.0 cm.

Endometrium

Thickness: 10 mm. IUD is identified within the upper segment of the
endometrium. No focal abnormality visualized.

Right ovary

Measurements: 2.2 x 1.4 x 1.9 cm.. Normal appearance/no adnexal
mass.

Left ovary

Measurements: 3.5 x 3.1 x 3.1 cm.. Dominant cyst is identified
within the left ovary measuring 2.3 x 2.0 x 1.3 cm.

Other findings

Trace free fluid identified within the pelvis.
IMPRESSION: 1. Normal appearance of the endometrium. If bleeding remains
unresponsive to hormonal or medical therapy, sonohysterogram should
be considered for focal lesion work-up. (Ref: Radiological
Reasoning: Algorithmic Workup of Abnormal Vaginal Bleeding with
Endovaginal Sonography and Sonohysterography. [id number]; [id number])
2. Anterior fundal fibroid.

## 2015-04-04 MED ORDER — NORETHINDRONE ACETATE 5 MG PO TABS: 5.0000 mg | ORAL_TABLET | Freq: Every day | ORAL | Status: DC

## 2015-04-04 NOTE — Progress Notes (Signed)
   Subjective:    Patient ID: Deborah Edwards, female    DOB: 1980-12-06, 34 y.o.   MRN: [id number]  HPI  34 yo MW P5 is here for 2 weeks of pain and bleeding. She had the Mirena placed  3/16 while breastfeeding. She has had no more than an occasional time of post coital bleeding until 2 weeks ago. She had +CT 10/16 and negative TOC 2 weeks ago.    Review of Systems     Objective:   Physical Exam WNWHWFNAD Breathing, conversing, and ambulating normally Speculum exam does not show IUD strings, small amount of blood noted tvs at bedside shows IUD to be visible, mostly in the fundus Abd- benign       Assessment & Plan:  New onset DUB with Mirena Check CT/GC again Official gyn u/s Norethindrone 5 mg prn

## 2015-04-05 LAB — GC/CHLAMYDIA PROBE AMP
CT PROBE, AMP APTIMA: NEGATIVE
GC Probe RNA: NEGATIVE

## 2015-04-09 ENCOUNTER — Telehealth: Payer: Self-pay | Admitting: *Deleted

## 2015-04-09 NOTE — Telephone Encounter (Signed)
LM on voicemail of [facility] IUD is in the correct position and her cultures are neg.

## 2015-05-15 ENCOUNTER — Other Ambulatory Visit (INDEPENDENT_AMBULATORY_CARE_PROVIDER_SITE_OTHER): Payer: Managed Care, Other (non HMO)

## 2015-05-15 DIAGNOSIS — Z113 Encounter for screening for infections with a predominantly sexual mode of transmission: Secondary | ICD-10-CM

## 2015-05-15 DIAGNOSIS — Z202 Contact with and (suspected) exposure to infections with a predominantly sexual mode of transmission: Secondary | ICD-10-CM

## 2015-05-15 NOTE — Progress Notes (Signed)
Pt called wanting to get checked for Chlamydia again.  She was positive in Nov.  She states that her husband is c/oing of penile burning and just wanted to make sure he hadn't been reinfected.

## 2015-05-16 ENCOUNTER — Telehealth: Payer: Self-pay | Admitting: *Deleted

## 2015-05-16 LAB — GC/CHLAMYDIA PROBE AMP, URINE
Chlamydia, Swab/Urine, PCR: NOT DETECTED
GC Probe Amp, Urine: NOT DETECTED

## 2015-05-16 NOTE — Telephone Encounter (Signed)
LM on voicemail of neg labs that were done yesterday.

## 2015-06-06 ENCOUNTER — Encounter: Payer: Self-pay | Admitting: Obstetrics & Gynecology

## 2015-06-06 ENCOUNTER — Other Ambulatory Visit: Payer: Managed Care, Other (non HMO)

## 2015-06-06 ENCOUNTER — Ambulatory Visit (INDEPENDENT_AMBULATORY_CARE_PROVIDER_SITE_OTHER): Payer: Managed Care, Other (non HMO) | Admitting: Obstetrics & Gynecology

## 2015-06-06 VITALS — BP 112/68 | HR 61 | Resp 16 | Ht 70.0 in | Wt 144.0 lb

## 2015-06-06 DIAGNOSIS — N926 Irregular menstruation, unspecified: Secondary | ICD-10-CM

## 2015-06-06 DIAGNOSIS — Z113 Encounter for screening for infections with a predominantly sexual mode of transmission: Secondary | ICD-10-CM

## 2015-06-06 LAB — CBC
HEMATOCRIT: 40 % (ref 36.0–46.0)
Hemoglobin: 13.2 g/dL (ref 12.0–15.0)
MCH: 28.3 pg (ref 26.0–34.0)
MCHC: 33 g/dL (ref 30.0–36.0)
MCV: 85.7 fL (ref 78.0–100.0)
MPV: 10 fL (ref 8.6–12.4)
Platelets: 427 10*3/uL — ABNORMAL HIGH (ref 150–400)
RBC: 4.67 MIL/uL (ref 3.87–5.11)
RDW: 14.6 % (ref 11.5–15.5)
WBC: 6 10*3/uL (ref 4.0–10.5)

## 2015-06-06 LAB — TSH: TSH: 1.945 u[IU]/mL (ref 0.350–4.500)

## 2015-06-06 NOTE — Progress Notes (Signed)
   Subjective:    Patient ID: Jenaye Galliher, female    DOB: [birth year], 35 y.o.   MRN: [id number]  HPI 35 yo MW P4 is here because she has irregular bleeding. She has an Mirena and uses Norethindrone BID to get rid of the bleeding. But she is getting annoyed with the bleeding. She wants another test for CT/GC.   Review of Systems Pap negative 12/16     Objective:   Physical Exam WNWHWFNAD Breathing, conversing, and ambulating normally       Assessment & Plan:  irregualr bleeding- probably due to Mirena but I will check CBC (h/o anemia), TSH, and cultures I gave her a sample pack of syfryl to use prn We discussed the option of l/s BS prn

## 2015-06-06 NOTE — Addendum Note (Signed)
Addended by: Gretchen Short on: 06/06/2015 10:22 AM   Modules accepted: Orders

## 2015-06-06 NOTE — Progress Notes (Signed)
Patient ID: Deborah Edwards, female   DOB: 1980/12/18, 35 y.o.   MRN: [id number] Pt states she would like to be tested for Chlamydia since she has been positive in the past. She also states she has been bleeding with IUD/OCP combo since November. IUD placement was confirmed at last visit. Pt states she takes OCP twice daily.

## 2015-06-07 ENCOUNTER — Telehealth: Payer: Self-pay | Admitting: *Deleted

## 2015-06-07 LAB — GC/CHLAMYDIA PROBE AMP
CT Probe RNA: NOT DETECTED
GC PROBE AMP APTIMA: NOT DETECTED

## 2015-06-07 NOTE — Telephone Encounter (Signed)
Pt notified of normal labs

## 2015-12-31 ENCOUNTER — Other Ambulatory Visit: Payer: Self-pay | Admitting: Family Medicine

## 2015-12-31 DIAGNOSIS — R102 Pelvic and perineal pain: Secondary | ICD-10-CM

## 2016-09-19 ENCOUNTER — Ambulatory Visit (INDEPENDENT_AMBULATORY_CARE_PROVIDER_SITE_OTHER): Payer: Managed Care, Other (non HMO) | Admitting: Family Medicine

## 2016-09-19 ENCOUNTER — Encounter: Payer: Self-pay | Admitting: Family Medicine

## 2016-09-19 ENCOUNTER — Ambulatory Visit (INDEPENDENT_AMBULATORY_CARE_PROVIDER_SITE_OTHER): Payer: Managed Care, Other (non HMO)

## 2016-09-19 DIAGNOSIS — M533 Sacrococcygeal disorders, not elsewhere classified: Secondary | ICD-10-CM | POA: Diagnosis not present

## 2016-09-19 IMAGING — DX DG SACRUM/COCCYX 2+V
3 series · 3 of 3 positions shown · non-contrast
Comparison: None.

CLINICAL DATA: Coccyx pain for 1 month.  No injury.

EXAM:
SACRUM AND COCCYX - 2+ VIEW

[coccyx ap]
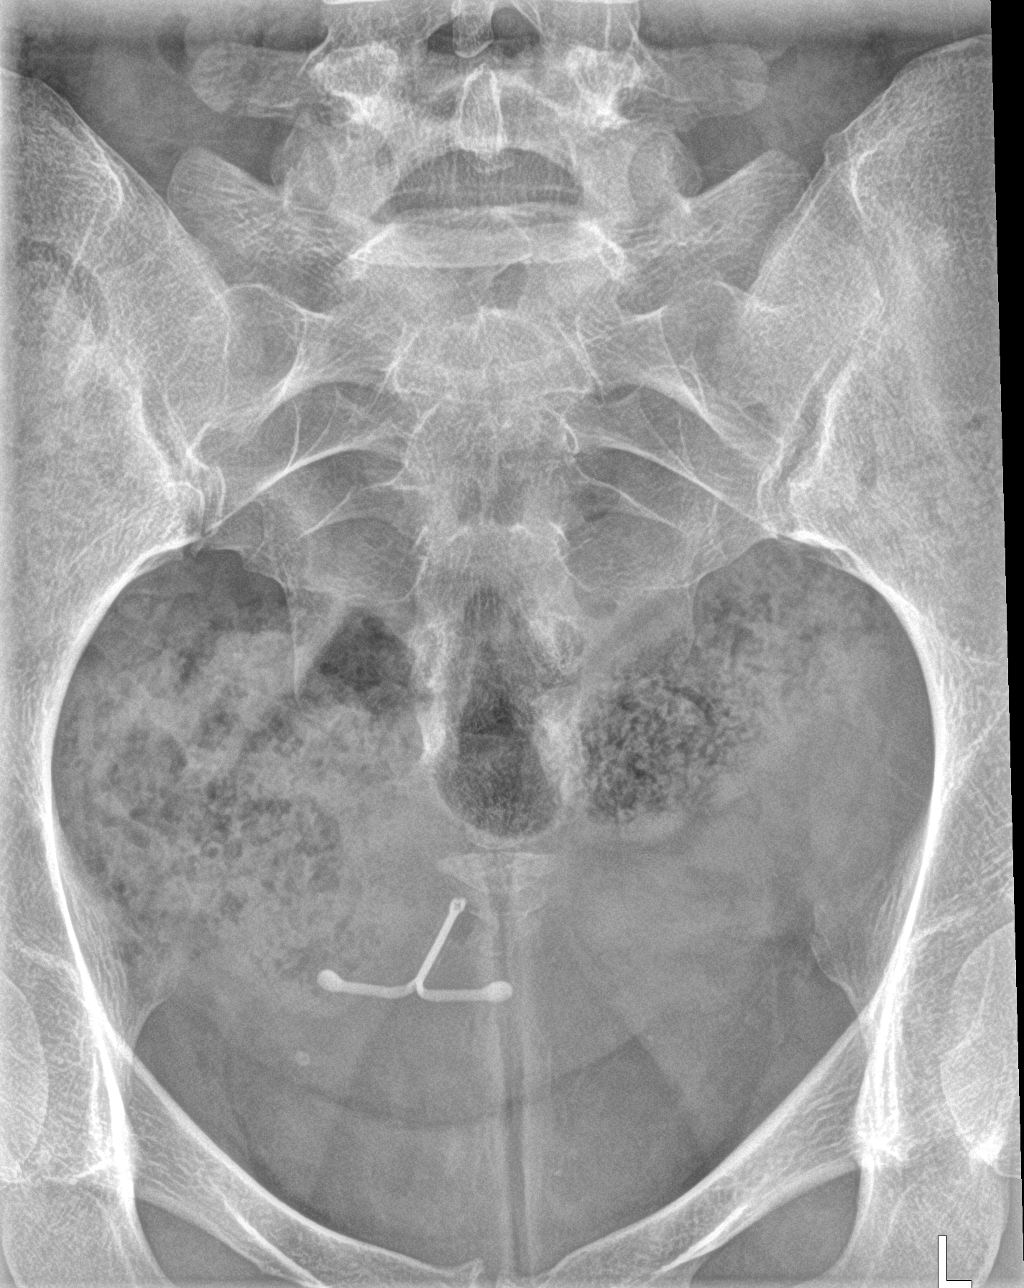

[sacrum ap]
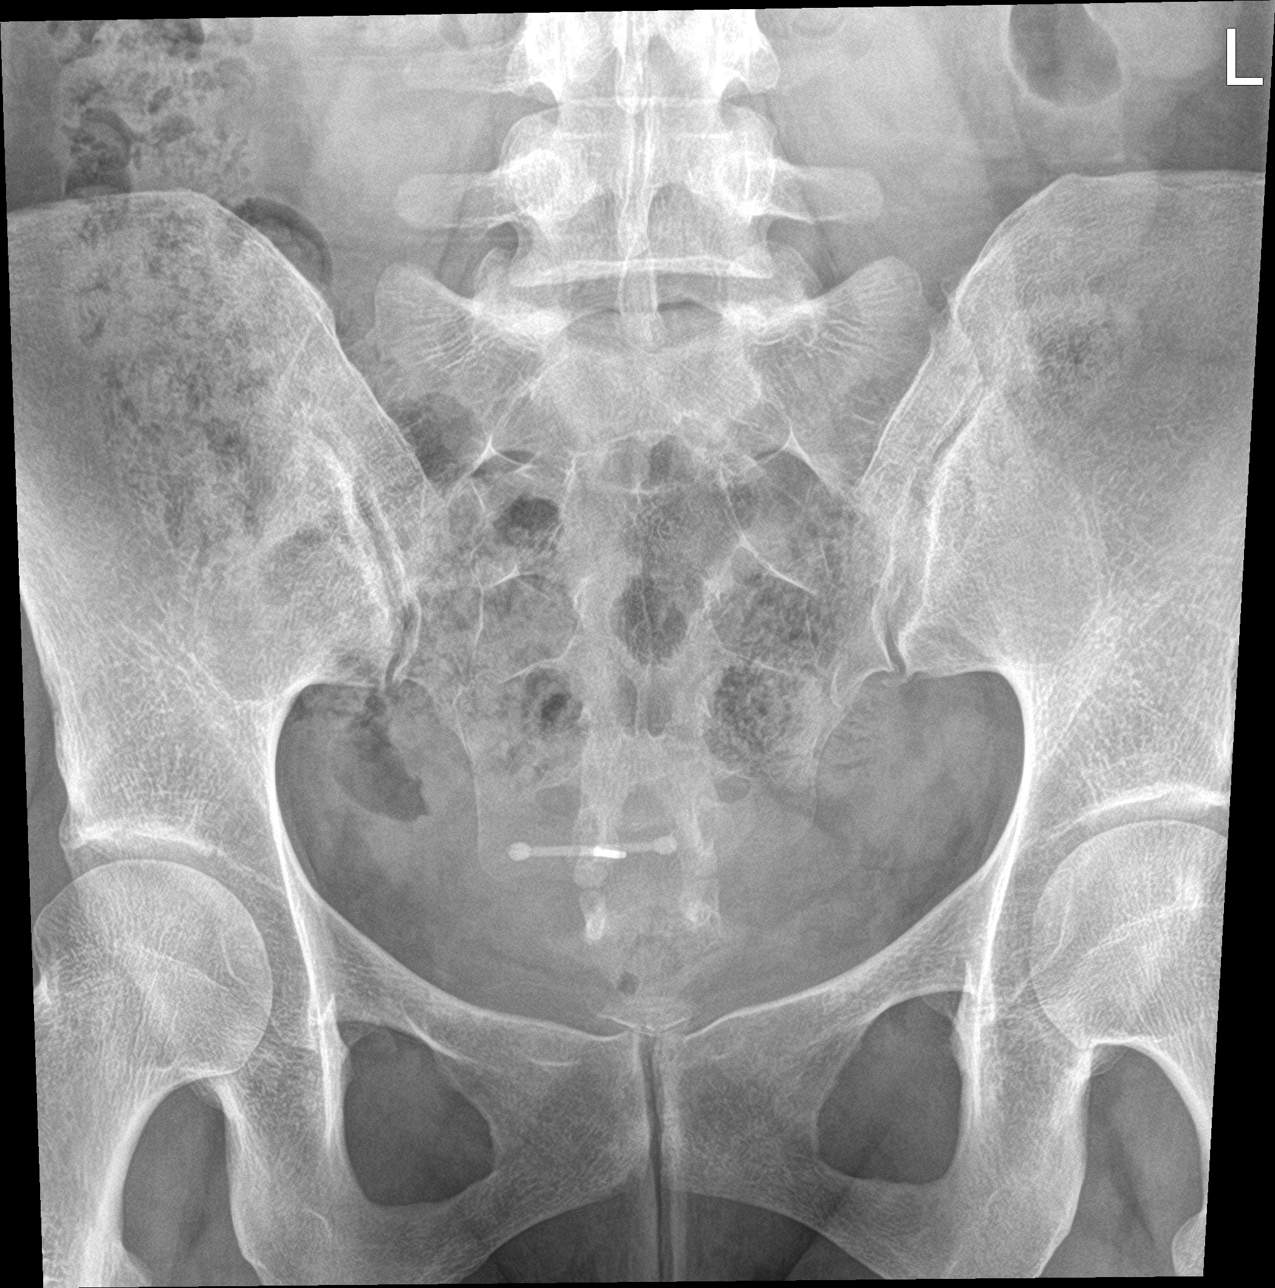

[sacrum lat]
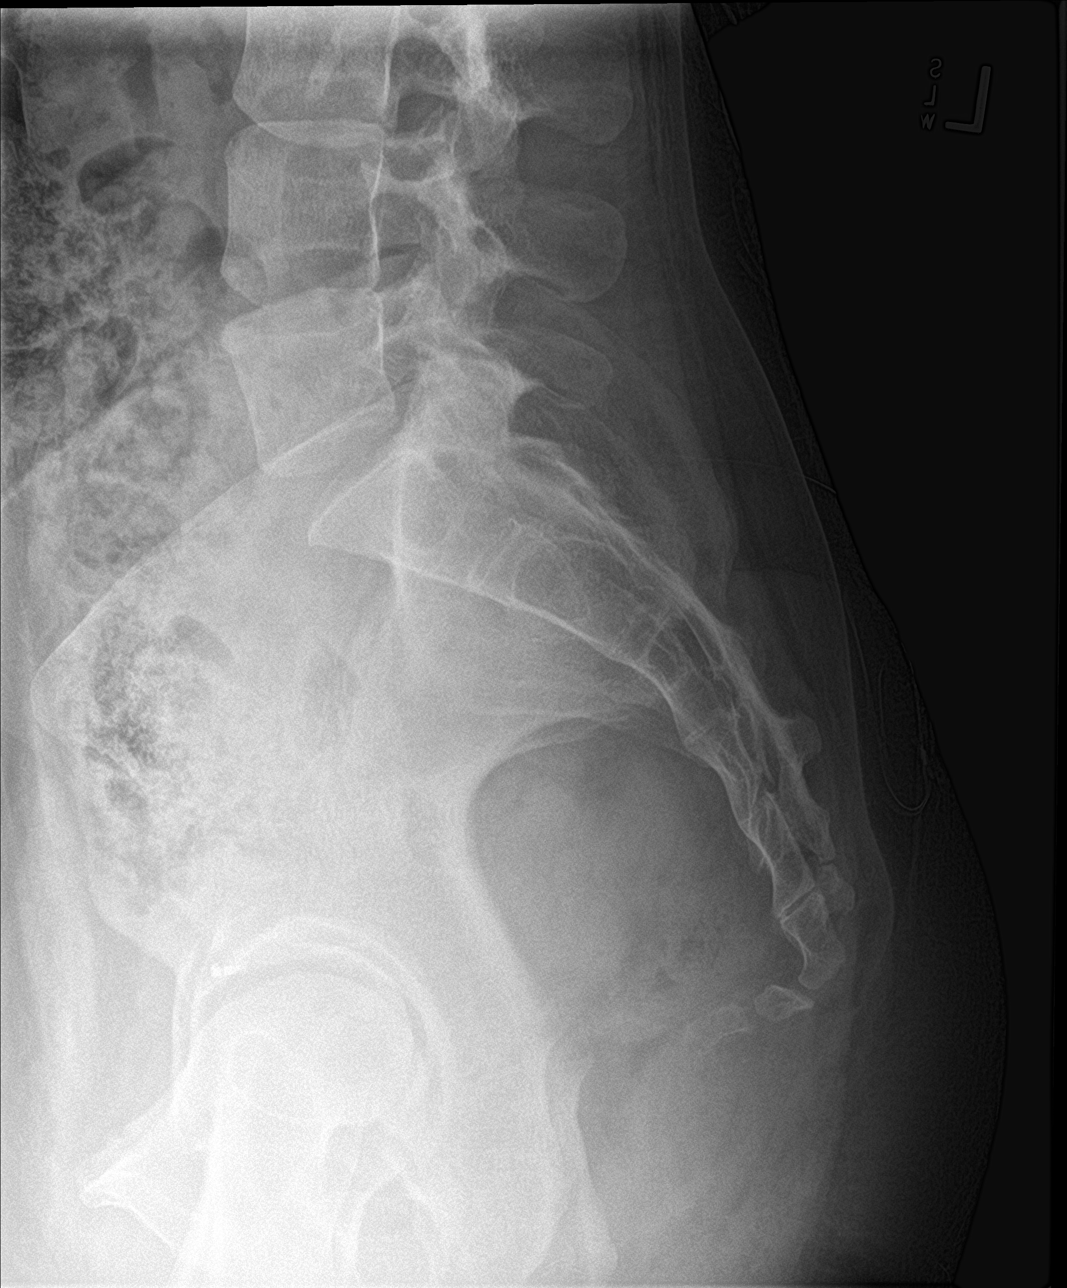

[3 of 3 positions shown; findings below may reference images not displayed]

FINDINGS: SI joints and hip joints are symmetric and unremarkable. No acute
bony abnormality or focal bone lesion. IUD in place..
IMPRESSION: Negative.

## 2016-09-19 MED ORDER — DICLOFENAC SODIUM 1 % TD GEL: 2.0000 g | Freq: Four times a day (QID) | TRANSDERMAL | 11 refills | Status: DC

## 2016-09-19 NOTE — Progress Notes (Signed)
Deborah Edwards is a 36 y.o. female who presents to [facility] today for coccyx pain. Patient has a several month history of pain in the right side of her coccyx/sacrum.  She denies any injury to this area swelling or fever or discharge. She denies any pain with bowel movements or with sex. She denies any pain with activity or exercise. She has pain is worse when she sits or lays. The pain has been worsening over the last few weeks. She's tried some over-the-counter medicines for pain which have not helped.   Past Medical History:  Diagnosis Date  . Abnormal Pap smear    colpo ok; 2011  . Irregular heart beat    Past Surgical History:  Procedure Laterality Date  . WISDOM TOOTH EXTRACTION     Social History  Substance Use Topics  . Smoking status: Former Smoker    Packs/day: 0.30    Years: 5.00    Types: Cigarettes    Quit date: 10/23/2001  . Smokeless tobacco: Never Used  . Alcohol use Yes     Comment: wine on occassion   family history includes Diabetes in her maternal grandmother; Heart failure in her father.  ROS:  No headache, visual changes, nausea, vomiting, diarrhea, constipation, dizziness, abdominal pain, skin rash, fevers, chills, night sweats, weight loss, swollen lymph nodes, body aches, joint swelling, muscle aches, chest pain, shortness of breath, mood changes, visual or auditory hallucinations.    Medications: Current Outpatient Prescriptions  Medication Sig Dispense Refill  . diclofenac sodium (VOLTAREN) 1 % GEL Apply 2 g topically 4 (four) times daily. To affected joint. 100 g 11  . ibuprofen (ADVIL,MOTRIN) 600 MG tablet Take 1 tablet (600 mg total) by mouth every 6 (six) hours as needed for headache, moderate pain or cramping. 30 tablet 0  . levonorgestrel (MIRENA) 20 MCG/24HR IUD 1 each by Intrauterine route once.    . lidocaine (XYLOCAINE) 2 % solution Use as directed 20 mLs in the mouth or throat as needed for  mouth pain. 100 mL 0  . norethindrone (AYGESTIN) 5 MG tablet Take 1 tablet (5 mg total) by mouth daily. 31 tablet 3  . valACYclovir (VALTREX) 1000 MG tablet TAKE 1 TABLET (1,000 MG TOTAL) BY MOUTH DAILY. 5 tablet 2   No current facility-administered medications for this visit.    No Known Allergies   Exam:  BP 120/83   Pulse 62   Wt 160 lb (72.6 kg)   BMI 22.96 kg/m  General: Well Developed, well nourished, and in no acute distress.  Neuro/Psych: Alert and oriented x3, extra-ocular muscles intact, able to move all 4 extremities, sensation grossly intact. Skin: Warm and dry, no rashes noted.  Respiratory: Not using accessory muscles, speaking in full sentences, trachea midline.  Cardiovascular: Pulses palpable, no extremity edema. Abdomen: Does not appear distended. MSK: Normal-appearing coccyx and sacral area. No skin redness induration or tenderness along the midline. She is tender to palpation along a prominence of bone near the inferior portion of her sacrum and the coccyx. This is just to the right of the midline. She has a very similar sized to palpation prominence on the left side that is nontender. The tenderness is not present with pelvis or leg motion. Normal gait.  Limited muscular ultrasound shows a bony prominence connected to the sacrum with a mild calcification seen in the soft tissue just superficial to the bone. The tenderness in the area is reproducible with compression of the ultrasound  probe. Structures are otherwise normal. The appearance of the bony prominences very similar to the contralateral nontender left side.the only difference is that she does not have a fleck of calcification seen within the soft tissue on the contralateral left side.  X-ray sacrum and coccyx is unremarkable. No fractures. A bony prominences clearly visible on the lateral view At the S4 level.        No results found for this or any previous visit (from the past 48 hour(s)). No results  found.    Assessment and Plan: 36 y.o. female with pain at the sacrum/coccyx likely due to bony prominence and pressure. We discussed options. Plan for offloading with pillows as well as diclofenac gel. If not better would consider advanced imaging or injection. Recheck in about a month.   Orders Placed This Encounter  Procedures  . DG Sacrum/Coccyx    Standing Status:   Future    Number of Occurrences:   1    Standing Expiration Date:   11/19/2017    Order Specific Question:   Reason for Exam (SYMPTOM  OR DIAGNOSIS REQUIRED)    Answer:   eval pain coccyx    Order Specific Question:   Is patient pregnant?    Answer:   No    Order Specific Question:   Preferred imaging location?    Answer:   [facility]    Order Specific Question:   Radiology Contrast Protocol - do NOT remove file path    Answer:   [email or web address]    Discussed warning signs or symptoms. Please see discharge instructions. Patient expresses understanding.

## 2016-09-19 NOTE — Patient Instructions (Signed)
Thank you for coming in today.  Apply the voltaren gel up to 4 x daily.   Get xray today.  Return in 1 month if not better.    Tailbone Injury The tailbone (coccyx) is the small bone at the lower end of the spine. A tailbone injury may involve stretched ligaments, bruising, or a broken bone (fracture). Tailbone injuries can be painful, and some may take a long time to heal. What are the causes? This condition is often caused by falling and landing on the tailbone. Other causes include:  Repeated strain or friction from actions such as rowing and bicycling.  Childbirth. In some cases, the cause may not be known. What increases the risk? This condition is more common in women than in men. What are the signs or symptoms? Symptoms of this condition include:  Pain in the lower back, especially when sitting.  Pain or difficulty when standing up from a sitting position.  Bruising in the tailbone area.  Painful bowel movements.  In women, pain during intercourse. How is this diagnosed? This condition may be diagnosed based on your symptoms and a physical exam. X-rays may be taken if a fracture is suspected. You may also have other tests, such as a CT scan or MRI. How is this treated? This condition may be treated with medicines to help relieve your pain. Most tailbone injuries heal on their own in 4-6 weeks. However, recovery time may be longer if the injury involves a fracture. Follow these instructions at home:  Take medicines only as directed by your health care provider.  If directed, apply ice to the injured area:  Put ice in a plastic bag.  Place a towel between your skin and the bag.  Leave the ice on for 20 minutes, 2-3 times per day for the first 1-2 days.  Sit on a large, rubber or inflated ring or cushion to ease your pain. Lean forward when you are sitting to help decrease discomfort.  Avoid sitting for long periods of time.  Increase your activity as the pain  allows. Perform any exercises that are recommended by your health care provider or physical therapist.  If you have pain during bowel movements, use stool softeners as directed by your health care provider.  Eat a diet that includes plenty of fiber to help prevent constipation.  Keep all follow-up visits as directed by your health care provider. This is important. How is this prevented? Wear appropriate padding and sports gear when bicycling and rowing. This can help to prevent developing an injury that is caused by repeated strain or friction. Contact a health care provider if:  Your pain becomes worse.  Your bowel movements cause a great deal of discomfort.  You are unable to have a bowel movement.  You have uncontrolled urine loss (urinary incontinence).  You have a fever. This information is not intended to replace advice given to you by your health care provider. Make sure you discuss any questions you have with your health care provider. Document Released: 04/25/2000 Document Revised: 12/27/2015 Document Reviewed: 04/24/2014 Elsevier Interactive Patient Education  2017 [employer].

## 2016-09-23 ENCOUNTER — Encounter: Payer: Managed Care, Other (non HMO) | Admitting: Family Medicine

## 2016-10-17 ENCOUNTER — Other Ambulatory Visit: Payer: Self-pay | Admitting: Family Medicine

## 2016-10-17 DIAGNOSIS — R102 Pelvic and perineal pain: Secondary | ICD-10-CM

## 2017-01-02 ENCOUNTER — Encounter: Payer: Self-pay | Admitting: Physician Assistant

## 2017-01-02 ENCOUNTER — Ambulatory Visit (INDEPENDENT_AMBULATORY_CARE_PROVIDER_SITE_OTHER): Payer: Managed Care, Other (non HMO) | Admitting: Physician Assistant

## 2017-01-02 VITALS — BP 121/75 | HR 59 | Wt 159.0 lb

## 2017-01-02 DIAGNOSIS — M62830 Muscle spasm of back: Secondary | ICD-10-CM | POA: Diagnosis not present

## 2017-01-02 DIAGNOSIS — M25512 Pain in left shoulder: Secondary | ICD-10-CM

## 2017-01-02 MED ORDER — PREDNISONE 50 MG PO TABS: ORAL_TABLET | ORAL | 0 refills | Status: DC

## 2017-01-02 MED ORDER — CYCLOBENZAPRINE HCL 10 MG PO TABS: 10.0000 mg | ORAL_TABLET | Freq: Every evening | ORAL | 0 refills | Status: DC | PRN

## 2017-01-02 NOTE — Patient Instructions (Signed)
-   Steroids for 5 days - Muscle relaxer at bedtime as needed for spasm - TENS unit 3000 on [employer] - ice or heat x 20 minutes 3 times per day  Shoulder Pain Many things can cause shoulder pain, including:  An injury to the area.  Overuse of the shoulder.  Arthritis.  The source of the pain can be:  Inflammation.  An injury to the shoulder joint.  An injury to a tendon, ligament, or bone.  Follow these instructions at home: Take these actions to help with your pain:  Squeeze a soft ball or a foam pad as much as possible. This helps to keep the shoulder from swelling. It also helps to strengthen the arm.  Take over-the-counter and prescription medicines only as told by your health care provider.  If directed, apply ice to the area: ? Put ice in a plastic bag. ? Place a towel between your skin and the bag. ? Leave the ice on for 20 minutes, 2-3 times per day. Stop applying ice if it does not help with the pain.  If you were given a shoulder sling or immobilizer: ? Wear it as told. ? Remove it to shower or bathe. ? Move your arm as little as possible, but keep your hand moving to prevent swelling.  Contact a health care provider if:  Your pain gets worse.  Your pain is not relieved with medicines.  New pain develops in your arm, hand, or fingers. Get help right away if:  Your arm, hand, or fingers: ? Tingle. ? Become numb. ? Become swollen. ? Become painful. ? Turn white or blue. This information is not intended to replace advice given to you by your health care provider. Make sure you discuss any questions you have with your health care provider. Document Released: 02/05/2005 Document Revised: 12/23/2015 Document Reviewed: 08/21/2014 Elsevier Interactive Patient Education  2017 [employer].

## 2017-01-02 NOTE — Progress Notes (Signed)
HPI:                                                                Deborah Edwards is a 36 y.o. female who presents to [facility] today for left shoulder pain  Patient reports pain in her posterior left shoulder/scapula area radiating to her left flank x 1 week. Pain is moderate, persistent. Endorses numbness and tingling in her left arm. Denies weakness. Denies known injury or trauma. Has tried Ibuprofen, which she states dulls the pain.  Past Medical History:  Diagnosis Date  . Abnormal Pap smear    colpo ok; 2011  . Irregular heart beat    Past Surgical History:  Procedure Laterality Date  . WISDOM TOOTH EXTRACTION     Social History  Substance Use Topics  . Smoking status: Former Smoker    Packs/day: 0.30    Years: 5.00    Types: Cigarettes    Quit date: 10/23/2001  . Smokeless tobacco: Never Used  . Alcohol use Yes     Comment: wine on occassion   family history includes Diabetes in her maternal grandmother; Heart failure in her father.  ROS: negative except as noted in the HPI  Medications: Current Outpatient Prescriptions  Medication Sig Dispense Refill  . cyclobenzaprine (FLEXERIL) 10 MG tablet Take 1 tablet (10 mg total) by mouth at bedtime as needed for muscle spasms. 30 tablet 0  . diclofenac sodium (VOLTAREN) 1 % GEL Apply 2 g topically 4 (four) times daily. To affected joint. 100 g 11  . ibuprofen (ADVIL,MOTRIN) 600 MG tablet Take 1 tablet (600 mg total) by mouth every 6 (six) hours as needed for headache, moderate pain or cramping. 30 tablet 0  . levonorgestrel (MIRENA) 20 MCG/24HR IUD 1 each by Intrauterine route once.    . lidocaine (XYLOCAINE) 2 % solution Use as directed 20 mLs in the mouth or throat as needed for mouth pain. 100 mL 0  . norethindrone (AYGESTIN) 5 MG tablet Take 1 tablet (5 mg total) by mouth daily. 31 tablet 3  . predniSONE (DELTASONE) 50 MG tablet One tab PO daily for 5 days. 5 tablet 0   . valACYclovir (VALTREX) 1000 MG tablet TAKE 1 TABLET (1,000 MG TOTAL) BY MOUTH DAILY. 5 tablet 2   No current facility-administered medications for this visit.    No Known Allergies     Objective:  BP 121/75   Pulse (!) 59   Wt 159 lb (72.1 kg)   BMI 22.81 kg/m  Gen:  alert, not ill-appearing, no distress, appropriate for age 17: head normocephalic without obvious abnormality, conjunctiva and cornea clear, trachea midline Pulm: Normal work of breathing, normal phonation Neuro: alert and oriented x 3, no tremor MSK: left shoulder atraumatic, no a/c joint tenderness, full active ROM, negative Neer's, negative liftoff test, negative Yergasons, there is a left-sided trapezial tenderness and spasm, back without abnormal curvature, normal ROM of spine, pain is reproduced with rotation and right lateral bend;  extremities atraumatic, normal gait and station Skin: intact, no rashes on exposed skin, no jaundice, no cyanosis   No results found for this or any previous visit (from the past 72 hour(s)). No results found.    Assessment and Plan: 36 y.o.  female with   1. Acute pain of left shoulder - rotator cuff muscles intact, no shoulder impingement signs - steroid burst, PT, TENS unit, ice/heat - cyclobenzaprine (FLEXERIL) 10 MG tablet; Take 1 tablet (10 mg total) by mouth at bedtime as needed for muscle spasms.  Dispense: 30 tablet; Refill: 0 - predniSONE (DELTASONE) 50 MG tablet; One tab PO daily for 5 days.  Dispense: 5 tablet; Refill: 0 - Ambulatory referral to Physical Therapy  2. Paraspinal muscle spasm - flexeril prn, physical therapy   Patient education and anticipatory guidance given Patient agrees with treatment plan Follow-up as needed if symptoms worsen or fail to improve  Darlyne Russian PA-C

## 2017-01-06 DIAGNOSIS — M25512 Pain in left shoulder: Secondary | ICD-10-CM | POA: Insufficient documentation

## 2017-01-06 DIAGNOSIS — M62830 Muscle spasm of back: Secondary | ICD-10-CM | POA: Insufficient documentation

## 2017-12-03 ENCOUNTER — Encounter: Payer: Self-pay | Admitting: Obstetrics & Gynecology

## 2017-12-03 ENCOUNTER — Ambulatory Visit (INDEPENDENT_AMBULATORY_CARE_PROVIDER_SITE_OTHER): Payer: Managed Care, Other (non HMO) | Admitting: Obstetrics & Gynecology

## 2017-12-03 VITALS — BP 115/76 | HR 70 | Resp 16 | Ht 70.0 in | Wt 158.0 lb

## 2017-12-03 DIAGNOSIS — Z1151 Encounter for screening for human papillomavirus (HPV): Secondary | ICD-10-CM | POA: Diagnosis not present

## 2017-12-03 DIAGNOSIS — Z01419 Encounter for gynecological examination (general) (routine) without abnormal findings: Secondary | ICD-10-CM

## 2017-12-03 DIAGNOSIS — Z30432 Encounter for removal of intrauterine contraceptive device: Secondary | ICD-10-CM | POA: Diagnosis not present

## 2017-12-03 DIAGNOSIS — Z124 Encounter for screening for malignant neoplasm of cervix: Secondary | ICD-10-CM | POA: Diagnosis not present

## 2017-12-03 DIAGNOSIS — L659 Nonscarring hair loss, unspecified: Secondary | ICD-10-CM

## 2017-12-03 DIAGNOSIS — R102 Pelvic and perineal pain unspecified side: Secondary | ICD-10-CM

## 2017-12-03 MED ORDER — VALACYCLOVIR HCL 1 G PO TABS: 1000.0000 mg | ORAL_TABLET | Freq: Every day | ORAL | 2 refills | Status: DC

## 2017-12-03 MED ORDER — NORGESTREL-ETHINYL ESTRADIOL 0.3-30 MG-MCG PO TABS: ORAL_TABLET | ORAL | 6 refills | Status: DC

## 2017-12-03 NOTE — Progress Notes (Signed)
Subjective:    Deborah Edwards is a 37 y.o. married P5 (36 down to 37 yo) female who presents for an annual exam. The patient has no complaints today. The patient is sexually active. GYN screening history: last pap: was normal. The patient wears seatbelts: yes. The patient participates in regular exercise: yes. Has the patient ever been transfused or tattooed?: yes. The patient reports that there is not domestic violence in her life.   Menstrual History: OB History    Gravida  5   Para  5   Term  5   Preterm      AB      Living  5     SAB      TAB      Ectopic      Multiple  0   Live Births  35           Menarche age: 67 No LMP recorded. (Menstrual status: IUD).    The following portions of the patient's history were reviewed and updated as appropriate: allergies, current medications, past family history, past medical history, past social history, past surgical history and problem list.  Review of Systems Pertinent items are noted in HPI.   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"- [employer] websites sweet sights, lovely rustic weddings, with love from vinyl   Objective:    BP 115/76   Pulse 70   Resp 16   Ht 5\' 10"  (1.778 m)   Wt 158 lb (71.7 kg)   BMI 22.67 kg/m   General Appearance:    Alert, cooperative, no distress, appears stated age  Head:    Normocephalic, without obvious abnormality, atraumatic  Eyes:    PERRL, conjunctiva/corneas clear, EOM's intact, fundi    benign, both eyes  Ears:    Normal TM's and external ear canals, both ears  Nose:   Nares normal, septum midline, mucosa normal, no drainage    or sinus tenderness  Throat:   Lips, mucosa, and tongue normal; teeth and gums normal  Neck:   Supple, symmetrical, trachea midline, no adenopathy;    thyroid:  no enlargement/tenderness/nodules; no carotid   bruit or JVD  Back:     Symmetric, no curvature, ROM normal, no CVA tenderness  Lungs:      Clear to auscultation bilaterally, respirations unlabored  Chest Wall:    No tenderness or deformity   Heart:    Regular rate and rhythm, S1 and S2 normal, no murmur, rub   or gallop  Breast Exam:    No tenderness, masses, or nipple abnormality  Abdomen:     Soft, non-tender, bowel sounds active all four quadrants,    no masses, no organomegaly  Genitalia:    Normal female without lesion, discharge or tenderness, IUD strings not visible. After prepping her cervix with betadine, I grasped the cervix with a single tooth tenaculum and used an IUD hook to retrieve the intact Mirena IUD.     Extremities:   Extremities normal, atraumatic, no cyanosis or edema  Pulses:   2+ and symmetric all extremities  Skin:   Skin color, texture, turgor normal, no rashes or lesions  Lymph nodes:   Cervical, supraclavicular, and axillary nodes normal  Neurologic:   CNII-XII intact, normal strength, sensation and reflexes    throughout  .    Assessment:    Healthy female exam.   Unwanted fertililty Hair loss, weight gain, anxiety   Plan:  Thin prep Pap smear. with cotesting Check TSH Discussed laparoscopic bilateral salpingectomy prn Start Lo ovral continuously BP check in 2 weeks Fasting labs at next visit

## 2017-12-04 LAB — CYTOLOGY - PAP
DIAGNOSIS: NEGATIVE
HPV: NOT DETECTED

## 2017-12-17 ENCOUNTER — Other Ambulatory Visit (INDEPENDENT_AMBULATORY_CARE_PROVIDER_SITE_OTHER): Payer: Managed Care, Other (non HMO)

## 2017-12-17 DIAGNOSIS — Z01419 Encounter for gynecological examination (general) (routine) without abnormal findings: Secondary | ICD-10-CM | POA: Diagnosis not present

## 2017-12-17 NOTE — Progress Notes (Signed)
BP today is 104/98 and P-68.  Fasting labs drawn

## 2017-12-18 LAB — COMPREHENSIVE METABOLIC PANEL
AG RATIO: 1.5 (calc) (ref 1.0–2.5)
ALBUMIN MSPROF: 4.3 g/dL (ref 3.6–5.1)
ALKALINE PHOSPHATASE (APISO): 33 U/L (ref 33–115)
ALT: 11 U/L (ref 6–29)
AST: 13 U/L (ref 10–30)
BILIRUBIN TOTAL: 0.9 mg/dL (ref 0.2–1.2)
BUN: 11 mg/dL (ref 7–25)
CALCIUM: 9.7 mg/dL (ref 8.6–10.2)
CHLORIDE: 108 mmol/L (ref 98–110)
CO2: 27 mmol/L (ref 20–32)
Creat: 0.75 mg/dL (ref 0.50–1.10)
GLOBULIN: 2.9 g/dL (ref 1.9–3.7)
Glucose, Bld: 86 mg/dL (ref 65–99)
Potassium: 5.6 mmol/L — ABNORMAL HIGH (ref 3.5–5.3)
Sodium: 141 mmol/L (ref 135–146)
Total Protein: 7.2 g/dL (ref 6.1–8.1)

## 2017-12-18 LAB — TSH: TSH: 3.48 mIU/L

## 2017-12-18 LAB — LIPID PANEL
Cholesterol: 159 mg/dL (ref <200)
HDL: 67 mg/dL (ref >50)
LDL Cholesterol (Calc): 77 mg/dL (calc)
NON-HDL CHOLESTEROL (CALC): 92 mg/dL (ref <130)
TRIGLYCERIDES: 74 mg/dL (ref <150)
Total CHOL/HDL Ratio: 2.4 (calc) (ref <5.0)

## 2017-12-18 LAB — CBC
HEMATOCRIT: 43.3 % (ref 35.0–45.0)
Hemoglobin: 14 g/dL (ref 11.7–15.5)
MCH: 29.8 pg (ref 27.0–33.0)
MCHC: 32.3 g/dL (ref 32.0–36.0)
MCV: 92.1 fL (ref 80.0–100.0)
MPV: 10.4 fL (ref 7.5–12.5)
PLATELETS: 333 10*3/uL (ref 140–400)
RBC: 4.7 10*6/uL (ref 3.80–5.10)
RDW: 13.2 % (ref 11.0–15.0)
WBC: 12.5 10*3/uL — AB (ref 3.8–10.8)

## 2018-01-19 ENCOUNTER — Encounter: Payer: Self-pay | Admitting: Emergency Medicine

## 2018-01-19 ENCOUNTER — Other Ambulatory Visit: Payer: Self-pay

## 2018-01-19 ENCOUNTER — Emergency Department (INDEPENDENT_AMBULATORY_CARE_PROVIDER_SITE_OTHER)
Admission: EM | Admit: 2018-01-19 | Discharge: 2018-01-19 | Disposition: A | Discharge status: Home / Self Care | Payer: Managed Care, Other (non HMO) | Source: Home / Self Care | Attending: Family Medicine | Admitting: Family Medicine

## 2018-01-19 DIAGNOSIS — S70362A Insect bite (nonvenomous), left thigh, initial encounter: Secondary | ICD-10-CM

## 2018-01-19 DIAGNOSIS — W57XXXA Bitten or stung by nonvenomous insect and other nonvenomous arthropods, initial encounter: Secondary | ICD-10-CM | POA: Diagnosis not present

## 2018-01-19 MED ORDER — DOXYCYCLINE HYCLATE 100 MG PO CAPS: 100.0000 mg | ORAL_CAPSULE | Freq: Two times a day (BID) | ORAL | 0 refills | Status: DC

## 2018-01-19 MED ORDER — PREDNISONE 20 MG PO TABS: ORAL_TABLET | ORAL | 0 refills | Status: DC

## 2018-01-19 NOTE — ED Triage Notes (Signed)
Insect bite left back upper thigh, today while working in the yard.

## 2018-01-19 NOTE — ED Provider Notes (Signed)
[facility]    CSN: [id number] Arrival date & time: 01/19/18  1537     History   Chief Complaint Chief Complaint  Patient presents with  . Insect Bite    HPI Deborah Edwards is a 37 y.o. female.   Patient felt a sudden stinging sensation on her left posterior/lateral thigh today while working in her yard.  The area has had increasing pain and rapidly expanding redness.  She did not see the insect.  She feels well otherwise.  Her last Tdap was 12/19/13.  The history is provided by the patient.  Rash  Location: left thigh. Quality: dryness, itchiness, painful, redness and swelling   Quality: not blistering, not bruising, not burning, not draining, not peeling and not weeping   Pain details:    Quality:  Aching   Severity:  Mild   Onset quality:  Sudden   Duration:  3 hours   Timing:  Constant   Progression:  Worsening Severity:  Moderate Onset quality:  Sudden Duration:  3 hours Timing:  Constant Progression:  Worsening Chronicity:  New Context: insect bite/sting   Relieved by:  Nothing Worsened by:  Nothing Ineffective treatments:  Antihistamines Associated symptoms: no abdominal pain, no fatigue, no fever, no headaches, no induration, no joint pain, no myalgias, no nausea, no shortness of breath, no sore throat, no throat swelling, no tongue swelling, not vomiting and not wheezing     Past Medical History:  Diagnosis Date  . Abnormal Pap smear    colpo ok; 2011  . Irregular heart beat     Patient Active Problem List   Diagnosis Date Noted  . Acute pain of left shoulder 01/06/2017  . Paraspinal muscle spasm 01/06/2017  . Coccyx pain 09/19/2016  . Cervical high risk HPV (human papillomavirus) test positive 06/23/2013    Past Surgical History:  Procedure Laterality Date  . WISDOM TOOTH EXTRACTION      OB History    Gravida  5   Para  5   Term  5   Preterm      AB      Living  5     SAB      TAB      Ectopic      Multiple    0   Live Births  5            Home Medications    Prior to Admission medications   Medication Sig Start Date End Date Taking? Authorizing Provider  doxycycline (VIBRAMYCIN) 100 MG capsule Take 1 capsule (100 mg total) by mouth 2 (two) times daily. Take with food. 01/19/18   Kandra Nicolas, MD  ibuprofen (ADVIL,MOTRIN) 600 MG tablet Take 1 tablet (600 mg total) by mouth every 6 (six) hours as needed for headache, moderate pain or cramping. 03/18/14   Constant, Peggy, MD  norgestrel-ethinyl estradiol (LO/OVRAL,CRYSELLE) 0.3-30 MG-MCG tablet Take an active pill daily, continuously. She will need to get refills sooner 12/03/17   Emily Filbert, MD  predniSONE (DELTASONE) 20 MG tablet Take one tab by mouth twice daily for 4 days, then one daily. Take with food. 01/19/18   Kandra Nicolas, MD  valACYclovir (VALTREX) 1000 MG tablet Take 1 tablet (1,000 mg total) by mouth daily. 12/03/17   Emily Filbert, MD    Family History Family History  Problem Relation Age of Onset  . Heart failure Father   . Diabetes Maternal Grandmother   . Cancer Neg Hx  Social History Social History   Tobacco Use  . Smoking status: Former Smoker    Packs/day: 0.30    Years: 5.00    Pack years: 1.50    Types: Cigarettes    Last attempt to quit: 10/23/2001    Years since quitting: 16.2  . Smokeless tobacco: Never Used  Substance Use Topics  . Alcohol use: Yes    Comment: wine on occassion  . Drug use: No     Allergies   Patient has no known allergies.   Review of Systems Review of Systems  Constitutional: Negative for fatigue and fever.  HENT: Negative for sore throat.   Respiratory: Negative for shortness of breath and wheezing.   Gastrointestinal: Negative for abdominal pain, nausea and vomiting.  Musculoskeletal: Negative for arthralgias and myalgias.  Skin: Positive for rash.  Neurological: Negative for headaches.  All other systems reviewed and are negative.    Physical Exam Triage  Vital Signs ED Triage Vitals  Enc Vitals Group     BP 01/19/18 1631 123/78     Pulse Rate 01/19/18 1631 68     Resp --      Temp 01/19/18 1631 98.3 F (36.8 C)     Temp Source 01/19/18 1631 Oral     SpO2 01/19/18 1631 100 %     Weight 01/19/18 1633 160 lb (72.6 kg)     Height 01/19/18 1633 5\' 10"  (1.778 m)     Head Circumference --      Peak Flow --      Pain Score 01/19/18 1632 4     Pain Loc --      Pain Edu? --      Excl. in [place]? --    No data found.  Updated Vital Signs BP 123/78 (BP Location: Right Arm)   Pulse 68   Temp 98.3 F (36.8 C) (Oral)   Ht 5\' 10"  (1.778 m)   Wt 72.6 kg   SpO2 100%   BMI 22.96 kg/m   Visual Acuity Right Eye Distance:   Left Eye Distance:   Bilateral Distance:    Right Eye Near:   Left Eye Near:    Bilateral Near:     Physical Exam  Constitutional: She appears well-developed and well-nourished. No distress.  HENT:  Head: Normocephalic.  Mouth/Throat: Oropharynx is clear and moist.  Eyes: Pupils are equal, round, and reactive to light. Conjunctivae are normal.  Neck: Neck supple.  Cardiovascular: Normal rate.  Pulmonary/Chest: Effort normal.  Musculoskeletal: She exhibits no edema.  Lymphadenopathy:    She has no cervical adenopathy.  Neurological: She is alert.  Skin: Skin is warm and dry. Rash noted. Rash is urticarial.     Left posterior/lateral thigh has a 10cm diameter circular erythematous urticarial-like lesion as noted on diagram.   The area is slightly warm but not tender to palpation.  Nursing note and vitals reviewed.      UC Treatments / Results  Labs (all labs ordered are listed, but only abnormal results are displayed) Labs Reviewed - No data to display  EKG None  Radiology No results found.  Procedures Procedures (including critical care time)  Medications Ordered in UC Medications - No data to display  Initial Impression / Assessment and Plan / UC Course  I have reviewed the triage vital  signs and the nursing notes.  Pertinent labs & imaging results that were available during my care of the patient were reviewed by me and considered in my medical  decision making (see chart for details).    ?early cellulitis vs reaction from insect sting Begin doxycycline for staph coverage, and prednisone burst/taper. Followup with Family Doctor if not improved in one week.    Final Clinical Impressions(s) / UC Diagnoses   Final diagnoses:  Insect bite of left thigh, initial encounter     Discharge Instructions     Apply ice pack for 10 to 15 minutes, 3 to 4 times daily  Continue until pain and swelling decrease.  May take antihistamine such as Zyrtec.  May apply 1% hydrocortisone for itching. If symptoms become significantly worse during the night or over the weekend, proceed to the local emergency room.     ED Prescriptions    Medication Sig Dispense Auth. Provider   doxycycline (VIBRAMYCIN) 100 MG capsule Take 1 capsule (100 mg total) by mouth 2 (two) times daily. Take with food. 14 capsule Kandra Nicolas, MD   predniSONE (DELTASONE) 20 MG tablet Take one tab by mouth twice daily for 4 days, then one daily. Take with food. 12 tablet Kandra Nicolas, MD        Kandra Nicolas, MD 01/21/18 1150

## 2018-01-19 NOTE — Discharge Instructions (Addendum)
Apply ice pack for 10 to 15 minutes, 3 to 4 times daily  Continue until pain and swelling decrease.  May take antihistamine such as Zyrtec.  May apply 1% hydrocortisone for itching. If symptoms become significantly worse during the night or over the weekend, proceed to the local emergency room.

## 2018-10-12 ENCOUNTER — Other Ambulatory Visit: Payer: Self-pay | Admitting: Obstetrics & Gynecology

## 2018-10-12 DIAGNOSIS — R102 Pelvic and perineal pain: Secondary | ICD-10-CM

## 2018-11-26 ENCOUNTER — Other Ambulatory Visit: Payer: Self-pay | Admitting: *Deleted

## 2018-11-26 MED ORDER — NORGESTREL-ETHINYL ESTRADIOL 0.3-30 MG-MCG PO TABS: ORAL_TABLET | ORAL | 0 refills | Status: DC

## 2018-11-26 NOTE — Telephone Encounter (Signed)
Pt called to schedule her annual and request a RF on her OCP's.  [facility] to [facility].

## 2018-12-10 ENCOUNTER — Ambulatory Visit: Payer: Managed Care, Other (non HMO) | Admitting: Certified Nurse Midwife

## 2018-12-10 ENCOUNTER — Other Ambulatory Visit: Payer: Self-pay | Admitting: *Deleted

## 2018-12-10 MED ORDER — NORGESTREL-ETHINYL ESTRADIOL 0.3-30 MG-MCG PO TABS: ORAL_TABLET | ORAL | 0 refills | Status: DC

## 2018-12-10 NOTE — Telephone Encounter (Signed)
Pt had to reschedule her annual due to her testing positive for Covid.  RF given on her [place].  [facility] to [facility]  [place].

## 2018-12-24 ENCOUNTER — Other Ambulatory Visit: Payer: Self-pay

## 2018-12-24 ENCOUNTER — Encounter: Payer: Self-pay | Admitting: Certified Nurse Midwife

## 2018-12-24 ENCOUNTER — Ambulatory Visit (INDEPENDENT_AMBULATORY_CARE_PROVIDER_SITE_OTHER): Payer: Managed Care, Other (non HMO) | Admitting: Certified Nurse Midwife

## 2018-12-24 VITALS — BP 126/78 | HR 77 | Resp 16 | Ht 70.0 in | Wt 165.0 lb

## 2018-12-24 DIAGNOSIS — Z3041 Encounter for surveillance of contraceptive pills: Secondary | ICD-10-CM | POA: Diagnosis not present

## 2018-12-24 DIAGNOSIS — Z01419 Encounter for gynecological examination (general) (routine) without abnormal findings: Secondary | ICD-10-CM

## 2018-12-24 MED ORDER — NORGESTREL-ETHINYL ESTRADIOL 0.3-30 MG-MCG PO TABS: 1.0000 | ORAL_TABLET | Freq: Every day | ORAL | 4 refills | Status: DC

## 2018-12-24 MED ORDER — NORGESTREL-ETHINYL ESTRADIOL 0.3-30 MG-MCG PO TABS: 1.0000 | ORAL_TABLET | Freq: Every day | ORAL | 11 refills | Status: DC

## 2018-12-24 NOTE — Progress Notes (Signed)
Gynecology Annual Exam   History of Present Illness: Patient is a 38 y.o. [id number] presents for annual exam. The patient has no complaints today. The patient is sexually active. She denies dyspareunia. The patient does not perform self breast exams. There is no notable family history of breast or ovarian cancer in her family. The patient denies current symptoms of depression.    Past Medical History:  Past Medical History:  Diagnosis Date  . Abnormal Pap smear    colpo ok; 2011  . COVID-19   . Irregular heart beat     Past Surgical History:  Past Surgical History:  Procedure Laterality Date  . WISDOM TOOTH EXTRACTION     Gynecologic History:  LMP: No LMP recorded. (Menstrual status: Oral contraceptives).- continuous Postcoital Bleeding: not applicable Dysmenorrhea: not applicable Contraception: OCP (estrogen/progesterone) Last Pap: completed on 12/03/17 ; result was: NIL and HR HPV negative  Mammogram: n/a  Obstetric History: [id number]  Family History:  Family History  Problem Relation Age of Onset  . Heart failure Father   . Diabetes Maternal Grandmother   . Cancer Neg Hx     Social History:  Social History   Socioeconomic History  . Marital status: Married    Spouse name: Not on file  . Number of children: Not on file  . Years of education: Not on file  . Highest education level: Not on file  Occupational History  . Occupation: homemaker  Social Needs  . Financial resource strain: Not on file  . Food insecurity    Worry: Not on file    Inability: Not on file  . Transportation needs    Medical: Not on file    Non-medical: Not on file  Tobacco Use  . Smoking status: Former Smoker    Packs/day: 0.30    Years: 5.00    Pack years: 1.50    Types: Cigarettes    Quit date: 10/23/2001    Years since quitting: 17.1  . Smokeless tobacco: Never Used  Substance and Sexual Activity  . Alcohol use: Yes    Comment: wine on occassion  . Drug use: No  . Sexual activity:  Yes    Partners: Male    Birth control/protection: Pill  Lifestyle  . Physical activity    Days per week: Not on file    Minutes per session: Not on file  . Stress: Not on file  Relationships  . Social Herbalist on phone: Not on file    Gets together: Not on file    Attends religious service: Not on file    Active member of club or organization: Not on file    Attends meetings of clubs or organizations: Not on file    Relationship status: Not on file  . Intimate partner violence    Fear of current or ex partner: Not on file    Emotionally abused: Not on file    Physically abused: Not on file    Forced sexual activity: Not on file  Other Topics Concern  . Not on file  Social History Narrative  . Not on file    Allergies:  No Known Allergies  Medications: Prior to Admission medications   Medication Sig Start Date End Date Taking? Authorizing Provider  ibuprofen (ADVIL,MOTRIN) 600 MG tablet Take 1 tablet (600 mg total) by mouth every 6 (six) hours as needed for headache, moderate pain or cramping. 03/18/14  Yes Constant, Peggy, MD  norgestrel-ethinyl estradiol (LO/OVRAL) 0.3-30 MG-MCG tablet  Take an active pill daily, continuously. She will need to get refills sooner 12/10/18  Yes Dove, Myra C, MD  norgestrel-ethinyl estradiol (LO/OVRAL) 0.3-30 MG-MCG tablet Take 1 tablet by mouth daily. 12/24/18   Julianne Handler, CNM  valACYclovir (VALTREX) 1000 MG tablet TAKE 1 TABLET BY MOUTH EVERY DAY Patient not taking: Reported on 12/24/2018 10/13/18   Emily Filbert, MD    Review of Systems: negative except noted in HPI  Physical Exam Vitals: BP 126/78   Pulse 77   Resp 16   Ht 5\' 10"  (1.778 m)   Wt 165 lb (74.8 kg)   BMI 23.68 kg/m  General: NAD HEENT: normocephalic, atraumatic Thyroid: no enlargement, no palpable nodules Pulmonary: Normal rate and effort, CTAB Cardiovascular: RRR Breast: Breast symmetrical, no tenderness, no palpable nodules or masses, no skin or  nipple retraction present, no nipple discharge. No axillary or supraclavicular lymphadenopathy. Abdomen: soft, non-tender, non-distended. No hepatomegaly, splenomegaly or masses palpable. No evidence of hernia  Genitourinary: deferred Extremities: no edema, erythema, or tenderness Neurologic: Grossly intact Psychiatric: mood appropriate, affect full  Female chaperone present for breast portion of the physical exam  Assessment:  1. Well woman exam   2. Oral contraceptive pill surveillance    Plan: Rx Lo/Ovral Discussed other options of contraception- Nuvaring and Patch, could use these continuous if chooses; had Mirena but removed d/t side effects; info packet given for both, will call back if wants to switch Follow up with GYN in 1 year or prn Follow up with PCP for yearly health maintenance- recommend lipids and basic labs  Julianne Handler, [place] 12/24/2018 10:07 AM

## 2020-03-07 ENCOUNTER — Telehealth: Payer: Self-pay

## 2020-03-07 DIAGNOSIS — Z789 Other specified health status: Secondary | ICD-10-CM

## 2020-03-07 MED ORDER — NORGESTREL-ETHINYL ESTRADIOL 0.3-30 MG-MCG PO TABS: 1.0000 | ORAL_TABLET | Freq: Every day | ORAL | 0 refills | Status: DC

## 2020-03-07 NOTE — Telephone Encounter (Signed)
Pt called to schedule annual appt and get a refill of OCP. Pt scheduled for 03/14/20. One refill sent until pt has appt.

## 2020-03-14 ENCOUNTER — Ambulatory Visit (INDEPENDENT_AMBULATORY_CARE_PROVIDER_SITE_OTHER): Payer: Managed Care, Other (non HMO) | Admitting: Obstetrics and Gynecology

## 2020-03-14 ENCOUNTER — Encounter: Payer: Self-pay | Admitting: Obstetrics and Gynecology

## 2020-03-14 ENCOUNTER — Other Ambulatory Visit: Payer: Self-pay

## 2020-03-14 VITALS — BP 121/76 | HR 75 | Resp 16 | Ht 70.0 in | Wt 161.0 lb

## 2020-03-14 DIAGNOSIS — Z0000 Encounter for general adult medical examination without abnormal findings: Secondary | ICD-10-CM | POA: Diagnosis not present

## 2020-03-14 DIAGNOSIS — Z789 Other specified health status: Secondary | ICD-10-CM

## 2020-03-14 MED ORDER — NORGESTREL-ETHINYL ESTRADIOL 0.3-30 MG-MCG PO TABS: 1.0000 | ORAL_TABLET | Freq: Every day | ORAL | 4 refills | Status: DC

## 2020-03-14 MED ORDER — NORGESTREL-ETHINYL ESTRADIOL 0.3-30 MG-MCG PO TABS: 1.0000 | ORAL_TABLET | Freq: Every day | ORAL | 5 refills | Status: DC

## 2020-03-14 NOTE — Progress Notes (Signed)
GYNECOLOGY ANNUAL PREVENTATIVE CARE ENCOUNTER NOTE  History:     Deborah Edwards is a 39 y.o. [id number] female here for a routine annual gynecologic exam.  Current complaints: None   Denies abnormal vaginal bleeding, discharge, pelvic pain, problems with intercourse or other gynecologic concerns.    Gynecologic History No LMP recorded. (Menstrual status: Oral contraceptives). Contraception: OCP- happy with pills  Last Pap: 2018. Results were: normal with negative HPV Last mammogram: NA   Obstetric History OB History  Gravida Para Term Preterm AB Living  5 5 5     5   SAB TAB Ectopic Multiple Live Births        0 5    # Outcome Date GA Lbr Len/2nd Weight Sex Delivery Anes PTL Lv  5 Term 03/17/14 [redacted]w[redacted]d 01:30 / 01:02 9 lb 5.4 oz (4.235 kg) M Vag-Spont None  LIV  4 Term 06/20/12 [redacted]w[redacted]d 05:09 / 00:18 8 lb 8 oz (3.856 kg) M Vag-Spont None  LIV  3 Term 06/27/08 [redacted]w[redacted]d  8 lb 5 oz (3.771 kg) M Vag-Spont EPI N LIV     Birth Comments: spontaneous labor  2 Term 01/25/06 [redacted]w[redacted]d  8 lb 14 oz (4.026 kg) M Vag-Spont EPI N LIV     Birth Comments: spontaneous labor  1 Term 01/30/02 [redacted]w[redacted]d  8 lb 10 oz (3.912 kg) M Vag-Spont EPI N LIV    Past Medical History:  Diagnosis Date  . Abnormal Pap smear    colpo ok; 2011  . COVID-19   . Irregular heart beat     Past Surgical History:  Procedure Laterality Date  . WISDOM TOOTH EXTRACTION      Current Outpatient Medications on File Prior to Visit  Medication Sig Dispense Refill  . ibuprofen (ADVIL,MOTRIN) 600 MG tablet Take 1 tablet (600 mg total) by mouth every 6 (six) hours as needed for headache, moderate pain or cramping. 30 tablet 0  . valACYclovir (VALTREX) 1000 MG tablet TAKE 1 TABLET BY MOUTH EVERY DAY 30 tablet 2   No current facility-administered medications on file prior to visit.    No Known Allergies  Social History:  reports that she quit smoking about 18 years ago. Her smoking use included cigarettes. She has a 1.50 pack-year  smoking history. She has never used smokeless tobacco. She reports current alcohol use. She reports that she does not use drugs.  Family History  Problem Relation Age of Onset  . Heart failure Father   . Diabetes Maternal Grandmother   . Cancer Neg Hx     The following portions of the patient's history were reviewed and updated as appropriate: allergies, current medications, past family history, past medical history, past social history, past surgical history and problem list.  Review of Systems Pertinent items noted in HPI and remainder of comprehensive ROS otherwise negative.  Physical Exam:  BP 121/76   Pulse 75   Resp 16   Ht 5\' 10"  (1.778 m)   Wt 161 lb (73 kg)   BMI 23.10 kg/m  CONSTITUTIONAL: Well-developed, well-nourished female in no acute distress.  HENT:  Normocephalic, atraumatic, External right and left ear normal. Oropharynx is clear and moist EYES: Conjunctivae and EOM are normal. Pupils are equal, round, and reactive to light. No scleral icterus.  NECK: Normal range of motion, supple, no masses.  Normal thyroid.  SKIN: Skin is warm and dry. No rash noted. Not diaphoretic. No erythema. No pallor. MUSCULOSKELETAL: Normal range of motion. No tenderness.  No cyanosis, clubbing,  or edema.  2+ distal pulses. NEUROLOGIC: Alert and oriented to person, place, and time. Normal reflexes, muscle tone coordination.  PSYCHIATRIC: Normal mood and affect. Normal behavior. Normal judgment and thought content. CARDIOVASCULAR: Normal heart rate noted, regular rhythm RESPIRATORY: Clear to auscultation bilaterally. Effort and breath sounds normal, no problems with respiration noted. BREASTS: Symmetric in size. No masses, tenderness, skin changes, nipple drainage, or lymphadenopathy bilaterally. Performed in the presence of a chaperone. ABDOMEN: Soft, no distention noted.  No tenderness, rebound or guarding.  PELVIC: Normal appearing external genitalia and urethral meatus; normal  appearing vaginal mucosa and cervix.  No abnormal discharge noted.  Pap smear obtained.  Normal uterine size, no other palpable masses, no uterine or adnexal tenderness.  Performed in the presence of a chaperone.   Assessment and Plan:   1. Uses birth control  Refill given   2. Annual physical exam  Doing well   Will follow up results of pap smear and manage accordingly. Routine preventative health maintenance measures emphasized. Please refer to After Visit Summary for other counseling recommendations.     Deborah Edwards, Deborah Edwards, Deborah Edwards for Deborah Edwards, Deborah Edwards

## 2020-05-30 ENCOUNTER — Telehealth: Payer: Self-pay

## 2020-05-30 DIAGNOSIS — Z3009 Encounter for other general counseling and advice on contraception: Secondary | ICD-10-CM

## 2020-05-30 MED ORDER — NORGESTREL-ETHINYL ESTRADIOL 0.3-30 MG-MCG PO TABS: 1.0000 | ORAL_TABLET | Freq: Every day | ORAL | 4 refills | Status: DC

## 2020-05-30 NOTE — Telephone Encounter (Signed)
Pt called stating that pharmacy charged her out of pocket due to how Rx was written for OCP. I spoke with pharmacy and they request we send a new prescription so they can fix this. Old Rx discontinued and new Rx sent.

## 2020-10-22 ENCOUNTER — Ambulatory Visit (INDEPENDENT_AMBULATORY_CARE_PROVIDER_SITE_OTHER): Payer: Managed Care, Other (non HMO) | Admitting: Family Medicine

## 2020-10-22 ENCOUNTER — Other Ambulatory Visit: Payer: Self-pay

## 2020-10-22 ENCOUNTER — Encounter: Payer: Self-pay | Admitting: Family Medicine

## 2020-10-22 DIAGNOSIS — Z3009 Encounter for other general counseling and advice on contraception: Secondary | ICD-10-CM

## 2020-10-22 DIAGNOSIS — Z302 Encounter for sterilization: Secondary | ICD-10-CM | POA: Insufficient documentation

## 2020-10-22 NOTE — Assessment & Plan Note (Signed)
Discussed bilateral salpingectomy, impact on ovarian cancer risk. She would like to proceed with this, but may change mind to clips only, due to increase risk at surgery with energy. Risks include but are not limited to bleeding, infection, injury to surrounding structures, including bowel, bladder and ureters, blood clots, and death.  Likelihood of success is high. Surgery described in detail.

## 2020-10-22 NOTE — Progress Notes (Signed)
   Subjective:    Patient ID: Deborah Edwards is a 40 y.o. female presenting with Disuss Surgery  on 10/22/2020  HPI: Patient is on OCPs and takes them continuously without giving herself a cycle. She is wanting to have a BTL due to concerns of OC failure. She strongly desires to not have more children. No prior abdominal surgery.  Review of Systems  Constitutional:  Negative for chills and fever.  Respiratory:  Negative for shortness of breath.   Cardiovascular:  Negative for chest pain.  Gastrointestinal:  Negative for abdominal pain, nausea and vomiting.  Genitourinary:  Negative for dysuria.  Skin:  Negative for rash.     Objective:    BP 127/80   Pulse 67   Ht 5\' 10"  (1.778 m)   Wt 170 lb (77.1 kg)   BMI 24.39 kg/m  Physical Exam Constitutional:      General: She is not in acute distress.    Appearance: She is well-developed.  HENT:     Head: Normocephalic and atraumatic.  Eyes:     General: No scleral icterus. Cardiovascular:     Rate and Rhythm: Normal rate.  Pulmonary:     Effort: Pulmonary effort is normal.  Abdominal:     Palpations: Abdomen is soft.  Musculoskeletal:     Cervical back: Neck supple.  Skin:    General: Skin is warm and dry.  Neurological:     Mental Status: She is alert and oriented to person, place, and time.        Assessment & Plan:   Problem List Items Addressed This Visit       Unprioritized   Encounter for consultation for female sterilization    Discussed bilateral salpingectomy, impact on ovarian cancer risk. She would like to proceed with this, but may change mind to clips only, due to increase risk at surgery with energy. Risks include but are not limited to bleeding, infection, injury to surrounding structures, including bowel, bladder and ureters, blood clots, and death.  Likelihood of success is high. Surgery described in detail.         Return in about 3 months (around 01/22/2021) for postop check.  Deborah Edwards 10/22/2020 9:10 AM

## 2020-10-24 ENCOUNTER — Encounter: Payer: Self-pay | Admitting: *Deleted

## 2020-10-24 ENCOUNTER — Telehealth: Payer: Self-pay | Admitting: *Deleted

## 2020-10-24 NOTE — Telephone Encounter (Signed)
Call to patient. Discussed available surgery date options.  Patient agreeable to 11-14-20. Brief review of post-op recovery and driving. Advised no driving for at least 24 hours. Advised [facility], 11-14-20 at 2p, arrive 12pm.  Encounter closed.

## 2020-10-30 ENCOUNTER — Encounter (HOSPITAL_BASED_OUTPATIENT_CLINIC_OR_DEPARTMENT_OTHER): Payer: Self-pay | Admitting: Family Medicine

## 2020-10-30 ENCOUNTER — Other Ambulatory Visit: Payer: Self-pay

## 2020-11-05 NOTE — H&P (Signed)
Deborah Edwards is an 40 y.o. [id number] female.   Chief Complaint: undesired fertility HPI: patient desires permanent sterility  Past Medical History:  Diagnosis Date   Abnormal Pap smear    colpo ok; 2011   COVID-19    Irregular heart beat     Past Surgical History:  Procedure Laterality Date   WISDOM TOOTH EXTRACTION      Family History  Problem Relation Age of Onset   Heart failure Father    Diabetes Maternal Grandmother    Cancer Neg Hx    Social History:  reports that she quit smoking about 19 years ago. Her smoking use included cigarettes. She has a 1.50 pack-year smoking history. She has never used smokeless tobacco. She reports current alcohol use. She reports that she does not use drugs.  Allergies: No Known Allergies  No medications prior to admission.    Pertinent items are noted in HPI.  Height 5\' 10"  (1.778 m), weight 77.1 kg. General appearance: alert, cooperative, and appears stated age Head: Normocephalic, without obvious abnormality, atraumatic Neck: supple, symmetrical, trachea midline Lungs:  normal effort Heart: regular rate and rhythm Abdomen: soft, non-tender; bowel sounds normal; no masses,  no organomegaly Extremities: extremities normal, atraumatic, no cyanosis or edema Skin: Skin color, texture, turgor normal. No rashes or lesions Neurologic: Grossly normal   Lab Results  Component Value Date   WBC 12.5 (H) 12/17/2017   HGB 14.0 12/17/2017   HCT 43.3 12/17/2017   MCV 92.1 12/17/2017   PLT 333 12/17/2017   Lab Results  Component Value Date   PREGTESTUR Negative 07/12/2014     Assessment/Plan Principal Problem:   Encounter for sterilization  Desires salpingectomy-for ovarian cancer risk reduction Risks include but are not limited to bleeding, infection, injury to surrounding structures, including bowel, bladder and ureters, blood clots, and death.  Likelihood of success is high.    Deborah Edwards 11/05/2020, 5:09 PM

## 2020-11-08 ENCOUNTER — Telehealth: Payer: Self-pay | Admitting: *Deleted

## 2020-11-08 NOTE — Telephone Encounter (Signed)
Call to patient- message received patient had questions about scheduled surgery. Patient had questions related to insurance coverage and billing. Advised should confirm benefits with insurance company, [employer] will contact her and she can request payment plan. Denies clinical questions.  Encounter closed.

## 2020-11-14 ENCOUNTER — Encounter (HOSPITAL_BASED_OUTPATIENT_CLINIC_OR_DEPARTMENT_OTHER): Payer: Self-pay | Admitting: Family Medicine

## 2020-11-14 ENCOUNTER — Other Ambulatory Visit: Payer: Self-pay

## 2020-11-14 ENCOUNTER — Encounter (HOSPITAL_BASED_OUTPATIENT_CLINIC_OR_DEPARTMENT_OTHER)
Admission: RE | Disposition: A | Discharge status: Home / Self Care | Payer: Self-pay | Source: Home / Self Care | Attending: Family Medicine

## 2020-11-14 ENCOUNTER — Ambulatory Visit (HOSPITAL_BASED_OUTPATIENT_CLINIC_OR_DEPARTMENT_OTHER): Payer: Managed Care, Other (non HMO) | Admitting: Anesthesiology

## 2020-11-14 ENCOUNTER — Telehealth: Payer: Self-pay | Admitting: *Deleted

## 2020-11-14 ENCOUNTER — Ambulatory Visit (HOSPITAL_BASED_OUTPATIENT_CLINIC_OR_DEPARTMENT_OTHER)
Admission: RE | Admit: 2020-11-14 | Discharge: 2020-11-14 | Disposition: A | Discharge status: Home / Self Care | Payer: Managed Care, Other (non HMO) | Attending: Family Medicine | Admitting: Family Medicine

## 2020-11-14 DIAGNOSIS — Z8616 Personal history of COVID-19: Secondary | ICD-10-CM | POA: Diagnosis not present

## 2020-11-14 DIAGNOSIS — Z87891 Personal history of nicotine dependence: Secondary | ICD-10-CM | POA: Insufficient documentation

## 2020-11-14 DIAGNOSIS — Z302 Encounter for sterilization: Secondary | ICD-10-CM

## 2020-11-14 HISTORY — PX: LAPAROSCOPIC BILATERAL SALPINGECTOMY: SHX5889

## 2020-11-14 LAB — POCT PREGNANCY, URINE: Preg Test, Ur: NEGATIVE

## 2020-11-14 SURGERY — SALPINGECTOMY, BILATERAL, LAPAROSCOPIC
Anesthesia: General | Site: Abdomen | Laterality: Bilateral

## 2020-11-14 MED ORDER — DEXAMETHASONE SODIUM PHOSPHATE 10 MG/ML IJ SOLN
INTRAMUSCULAR | Status: DC | PRN
  Administered 2020-11-14: 5 mg via INTRAVENOUS

## 2020-11-14 MED ORDER — LACTATED RINGERS IV SOLN: INTRAVENOUS | Status: DC

## 2020-11-14 MED ORDER — ACETAMINOPHEN 500 MG PO TABS
1000.0000 mg | ORAL_TABLET | Freq: Once | ORAL | Status: AC
  Administered 2020-11-14: 1000 mg via ORAL

## 2020-11-14 MED ORDER — GLYCOPYRROLATE PF 0.2 MG/ML IJ SOSY
PREFILLED_SYRINGE | INTRAMUSCULAR | Status: AC
  Filled 2020-11-14: qty 1

## 2020-11-14 MED ORDER — ACETAMINOPHEN 500 MG PO TABS
ORAL_TABLET | ORAL | Status: AC
  Filled 2020-11-14: qty 2

## 2020-11-14 MED ORDER — MIDAZOLAM HCL 2 MG/2ML IJ SOLN
INTRAMUSCULAR | Status: AC
  Filled 2020-11-14: qty 2

## 2020-11-14 MED ORDER — FENTANYL CITRATE (PF) 100 MCG/2ML IJ SOLN
INTRAMUSCULAR | Status: DC | PRN
  Administered 2020-11-14: 100 ug via INTRAVENOUS

## 2020-11-14 MED ORDER — MIDAZOLAM HCL 5 MG/5ML IJ SOLN
INTRAMUSCULAR | Status: DC | PRN
  Administered 2020-11-14: 2 mg via INTRAVENOUS

## 2020-11-14 MED ORDER — BUPIVACAINE HCL (PF) 0.25 % IJ SOLN
INTRAMUSCULAR | Status: DC | PRN
  Administered 2020-11-14: 11 mL

## 2020-11-14 MED ORDER — SCOPOLAMINE 1 MG/3DAYS TD PT72
1.0000 | MEDICATED_PATCH | Freq: Once | TRANSDERMAL | Status: DC
  Administered 2020-11-14: 1.5 mg via TRANSDERMAL

## 2020-11-14 MED ORDER — PHENYLEPHRINE 40 MCG/ML (10ML) SYRINGE FOR IV PUSH (FOR BLOOD PRESSURE SUPPORT)
PREFILLED_SYRINGE | INTRAVENOUS | Status: AC
  Filled 2020-11-14: qty 10

## 2020-11-14 MED ORDER — OXYCODONE HCL 5 MG/5ML PO SOLN: 5.0000 mg | Freq: Once | ORAL | Status: DC | PRN

## 2020-11-14 MED ORDER — OXYCODONE HCL 5 MG PO TABS: 5.0000 mg | ORAL_TABLET | Freq: Four times a day (QID) | ORAL | 0 refills | Status: AC | PRN

## 2020-11-14 MED ORDER — KETOROLAC TROMETHAMINE 30 MG/ML IJ SOLN
INTRAMUSCULAR | Status: AC
  Filled 2020-11-14: qty 1

## 2020-11-14 MED ORDER — PROPOFOL 10 MG/ML IV BOLUS
INTRAVENOUS | Status: AC
  Filled 2020-11-14: qty 20

## 2020-11-14 MED ORDER — FENTANYL CITRATE (PF) 100 MCG/2ML IJ SOLN
INTRAMUSCULAR | Status: AC
  Filled 2020-11-14: qty 2

## 2020-11-14 MED ORDER — PROPOFOL 10 MG/ML IV BOLUS
INTRAVENOUS | Status: DC | PRN
  Administered 2020-11-14: 160 mg via INTRAVENOUS

## 2020-11-14 MED ORDER — DEXAMETHASONE SODIUM PHOSPHATE 10 MG/ML IJ SOLN
INTRAMUSCULAR | Status: AC
  Filled 2020-11-14: qty 1

## 2020-11-14 MED ORDER — POVIDONE-IODINE 10 % EX SWAB
2.0000 "application " | Freq: Once | CUTANEOUS | Status: AC
  Administered 2020-11-14: 2 via TOPICAL

## 2020-11-14 MED ORDER — 0.9 % SODIUM CHLORIDE (POUR BTL) OPTIME
TOPICAL | Status: DC | PRN
  Administered 2020-11-14: 1000 mL

## 2020-11-14 MED ORDER — SUGAMMADEX SODIUM 500 MG/5ML IV SOLN
INTRAVENOUS | Status: DC | PRN
  Administered 2020-11-14: 400 mg via INTRAVENOUS

## 2020-11-14 MED ORDER — ROCURONIUM BROMIDE 100 MG/10ML IV SOLN
INTRAVENOUS | Status: DC | PRN
  Administered 2020-11-14: 80 mg via INTRAVENOUS

## 2020-11-14 MED ORDER — ONDANSETRON HCL 4 MG/2ML IJ SOLN
INTRAMUSCULAR | Status: DC | PRN
  Administered 2020-11-14: 4 mg via INTRAVENOUS

## 2020-11-14 MED ORDER — LIDOCAINE 2% (20 MG/ML) 5 ML SYRINGE
INTRAMUSCULAR | Status: DC | PRN
  Administered 2020-11-14: 80 mg via INTRAVENOUS

## 2020-11-14 MED ORDER — OXYCODONE HCL 5 MG PO TABS: 5.0000 mg | ORAL_TABLET | Freq: Once | ORAL | Status: DC | PRN

## 2020-11-14 MED ORDER — LIDOCAINE HCL (PF) 2 % IJ SOLN
INTRAMUSCULAR | Status: AC
  Filled 2020-11-14: qty 5

## 2020-11-14 MED ORDER — GLYCOPYRROLATE 0.2 MG/ML IJ SOLN
INTRAMUSCULAR | Status: DC | PRN
  Administered 2020-11-14: .2 mg via INTRAVENOUS

## 2020-11-14 MED ORDER — PHENYLEPHRINE HCL (PRESSORS) 10 MG/ML IV SOLN
INTRAVENOUS | Status: DC | PRN
  Administered 2020-11-14: 40 ug via INTRAVENOUS

## 2020-11-14 MED ORDER — FENTANYL CITRATE (PF) 100 MCG/2ML IJ SOLN: 25.0000 ug | INTRAMUSCULAR | Status: DC | PRN

## 2020-11-14 MED ORDER — KETOROLAC TROMETHAMINE 30 MG/ML IJ SOLN
INTRAMUSCULAR | Status: DC | PRN
  Administered 2020-11-14: 30 mg via INTRAVENOUS

## 2020-11-14 MED ORDER — ACETAMINOPHEN 500 MG PO TABS: 1000.0000 mg | ORAL_TABLET | ORAL | Status: DC

## 2020-11-14 MED ORDER — PROMETHAZINE HCL 25 MG/ML IJ SOLN: 6.2500 mg | INTRAMUSCULAR | Status: DC | PRN

## 2020-11-14 MED ORDER — ONDANSETRON HCL 4 MG/2ML IJ SOLN
INTRAMUSCULAR | Status: AC
  Filled 2020-11-14: qty 2

## 2020-11-14 MED ORDER — SCOPOLAMINE 1 MG/3DAYS TD PT72
MEDICATED_PATCH | TRANSDERMAL | Status: AC
  Filled 2020-11-14: qty 1

## 2020-11-14 MED ORDER — SUGAMMADEX SODIUM 500 MG/5ML IV SOLN
INTRAVENOUS | Status: AC
  Filled 2020-11-14: qty 5

## 2020-11-14 SURGICAL SUPPLY — 32 items
BAG SPEC RTRVL LRG 6X4 10 (ENDOMECHANICALS)
BLADE SURG 15 STRL LF DISP TIS (BLADE) ×1 IMPLANT
BLADE SURG 15 STRL SS (BLADE) ×2
CABLE HIGH FREQUENCY MONO STRZ (ELECTRODE) IMPLANT
CATH ROBINSON RED A/P 16FR (CATHETERS) IMPLANT
DRSG OPSITE POSTOP 3X4 (GAUZE/BANDAGES/DRESSINGS) IMPLANT
DURAPREP 26ML APPLICATOR (WOUND CARE) ×2 IMPLANT
GAUZE 4X4 16PLY ~~LOC~~+RFID DBL (SPONGE) ×2 IMPLANT
GLOVE SURG LTX SZ7 (GLOVE) ×2 IMPLANT
GLOVE SURG UNDER POLY LF SZ7 (GLOVE) ×8 IMPLANT
GOWN STRL REUS W/TWL LRG LVL3 (GOWN DISPOSABLE) ×4 IMPLANT
NS IRRIG 1000ML POUR BTL (IV SOLUTION) ×2 IMPLANT
PACK LAPAROSCOPY BASIN (CUSTOM PROCEDURE TRAY) ×2 IMPLANT
PACK TRENDGUARD 450 HYBRID PRO (MISCELLANEOUS) IMPLANT
PACK TRENDGUARD 600 HYBRD PROC (MISCELLANEOUS) IMPLANT
PAD OB MATERNITY 4.3X12.25 (PERSONAL CARE ITEMS) ×2 IMPLANT
PAD PREP 24X48 CUFFED NSTRL (MISCELLANEOUS) ×2 IMPLANT
POUCH SPECIMEN RETRIEVAL 10MM (ENDOMECHANICALS) IMPLANT
SET IRRIG TUBING LAPAROSCOPIC (IRRIGATION / IRRIGATOR) IMPLANT
SET TUBE SMOKE EVAC HIGH FLOW (TUBING) ×2 IMPLANT
SHEARS HARMONIC ACE PLUS 36CM (ENDOMECHANICALS) IMPLANT
SLEEVE ENDOPATH XCEL 5M (ENDOMECHANICALS) IMPLANT
SLEEVE SCD COMPRESS KNEE MED (STOCKING) ×2 IMPLANT
SUT VIC AB 3-0 X1 27 (SUTURE) ×2 IMPLANT
SUT VICRYL 0 UR6 27IN ABS (SUTURE) ×4 IMPLANT
TOWEL GREEN STERILE FF (TOWEL DISPOSABLE) ×4 IMPLANT
TRAY FOLEY W/BAG SLVR 14FR LF (SET/KITS/TRAYS/PACK) IMPLANT
TRENDGUARD 450 HYBRID PRO PACK (MISCELLANEOUS)
TRENDGUARD 600 HYBRID PROC PK (MISCELLANEOUS)
TROCAR BALLN 12MMX100 BLUNT (TROCAR) IMPLANT
TROCAR XCEL NON-BLD 5MMX100MML (ENDOMECHANICALS) ×2 IMPLANT
WARMER LAPAROSCOPE (MISCELLANEOUS) ×2 IMPLANT

## 2020-11-14 NOTE — Op Note (Signed)
PROCEDURE DATE: 11/14/2020  PREOPERATIVE DIAGNOSES: Undesired fertility  POSTOPERATIVE DIAGNOSES: The same   PROCEDURE: Laparoscopic bilateral salpingctomy   SURGEON: Dr. [name]   ASSISTANT: None  ANESTHESIOLOGIST: Lynda Rainwater, MD MD - [facility]  INDICATIONS: 40 y.o. [id number] who desired permanent sterilization.  FINDINGS: Normal appearing uterus, tubes and ovaries   ESTIMATED BLOOD LOSS: 25 ml   SPECIMENS: Bilateral fallopian tubes to pathology  COMPLICATIONS: None immediately known   PROCEDURE IN DETAIL: The patient had sequential compression devices applied to her lower extremities while in the preoperative area. She was then taken to the operating room where general anesthesia was administered and was found to be adequate. She was placed in the dorsal lithotomy position, and was prepped and draped in a sterile manner. A Red rubber catheter was inserted into her bladder and drained a clear unknown amount of urine. A speculum was placed inside the vagina, and the cervix grasped with an single tooth tenaculum. A Hulka tenaculum was placed through the cervix for uterine manipulation. Attention was then turned to the patient's abdomen where a 11-mm skin incision was made in the umbilicus.  This was carried down to the underlying fascia and peritoneum.  The fascia was tagged with 0 Vicryl suture on a UR-6. Intraperitoneal placement was confirmed and insufflation done. A survey of the patient's pelvis and abdomen revealed the findings above. Two left sided 5-mm lower quadrant ports were then placed under direct visualization. On the right side, the tube was separated from the mesosalpinx with the Harmonic device.  On the left side, the tube was separated from the mesosalpinx with the Harmonic device.  The specimen was then removed from the abdomen through the 11-mm port, under direct visualization. The operative site was surveyed, minimal bleeding noted at uterus on left, touched with coag  on the Harmonic device and then found to be hemostatic .No intraoperative injury to other surrounding organs was noted. The abdomen was desufflated and all instruments were then removed from the patient's abdomen. Fascial closure was completed with aforementioned 0 Vicryl on a UR-6. All skin incisions were closed with 3-0 Vicryl subcuticular stitches/Dermabond.   [name] MD 11/14/2020 3:29 PM

## 2020-11-14 NOTE — Anesthesia Postprocedure Evaluation (Signed)
Anesthesia Post Note  Patient: Gisella Alwine  Procedure(s) Performed: LAPAROSCOPIC BILATERAL SALPINGECTOMY (Bilateral: Abdomen)     Patient location during evaluation: PACU Anesthesia Type: General Level of consciousness: awake and alert Pain management: pain level controlled Vital Signs Assessment: post-procedure vital signs reviewed and stable Respiratory status: spontaneous breathing, nonlabored ventilation and respiratory function stable Cardiovascular status: blood pressure returned to baseline and stable Postop Assessment: no apparent nausea or vomiting Anesthetic complications: no   No notable events documented.  Last Vitals:  Vitals:   11/14/20 1600 11/14/20 1634  BP: 118/78 126/82  Pulse: 72 74  Resp: 19 17  Temp:  36.8 C  SpO2: 99% 98%    Last Pain:  Vitals:   11/14/20 1616  TempSrc:   PainSc: 0-No pain                 Lynda Rainwater

## 2020-11-14 NOTE — Transfer of Care (Signed)
Immediate Anesthesia Transfer of Care Note  Patient: Deborah Edwards  Procedure(s) Performed: LAPAROSCOPIC BILATERAL SALPINGECTOMY (Bilateral: Abdomen)  Patient Location: PACU  Anesthesia Type:General  Level of Consciousness: awake, alert  and oriented  Airway & Oxygen Therapy: Patient Spontanous Breathing and Patient connected to face mask oxygen  Post-op Assessment: Report given to RN and Post -op Vital signs reviewed and stable  Post vital signs: Reviewed and stable  Last Vitals:  Vitals Value Taken Time  BP 117/80 11/14/20 1540  Temp    Pulse 81 11/14/20 1542  Resp 10 11/14/20 1542  SpO2 98 % 11/14/20 1542  Vitals shown include unvalidated device data.  Last Pain:  Vitals:   11/14/20 1230  TempSrc: Oral  PainSc: 0-No pain      Patients Stated Pain Goal: 6 (99/35/70 1779)  Complications: No notable events documented.

## 2020-11-14 NOTE — Interval H&P Note (Signed)
History and Physical Interval Note:  11/14/2020 2:03 PM  Deborah Edwards  has presented today for surgery, with the diagnosis of Undesired fertility.  The various methods of treatment have been discussed with the patient and family. After consideration of risks, benefits and other options for treatment, the patient has consented to  Procedure(s): LAPAROSCOPIC BILATERAL SALPINGECTOMY (Bilateral) as a surgical intervention.  The patient's history has been reviewed, patient examined, no change in status, stable for surgery.  I have reviewed the patient's chart and labs.  Questions were answered to the patient's satisfaction.     [name]

## 2020-11-14 NOTE — Anesthesia Procedure Notes (Signed)
Procedure Name: Intubation Date/Time: 11/14/2020 2:44 PM Performed by: Lavonia Dana, CRNA Pre-anesthesia Checklist: Patient identified, Emergency Drugs available, Suction available and Patient being monitored Patient Re-evaluated:Patient Re-evaluated prior to induction Oxygen Delivery Method: Circle system utilized Preoxygenation: Pre-oxygenation with 100% oxygen Induction Type: IV induction Ventilation: Mask ventilation without difficulty Laryngoscope Size: Mac and 3 Grade View: Grade I Tube type: Oral Tube size: 7.0 mm Number of attempts: 1 Airway Equipment and Method: Stylet and Oral airway Placement Confirmation: ETT inserted through vocal cords under direct vision, positive ETCO2 and breath sounds checked- equal and bilateral Secured at: 24 cm Tube secured with: Tape Dental Injury: Teeth and Oropharynx as per pre-operative assessment

## 2020-11-14 NOTE — Discharge Instructions (Addendum)
  Post Anesthesia Home Care Instructions  Activity: Get plenty of rest for the remainder of the day. A responsible individual must stay with you for 24 hours following the procedure.  For the next 24 hours, DO NOT: -Drive a car -Paediatric nurse -Drink alcoholic beverages -Take any medication unless instructed by your physician -Make any legal decisions or sign important papers.  Meals: Start with liquid foods such as gelatin or soup. Progress to regular foods as tolerated. Avoid greasy, spicy, heavy foods. If nausea and/or vomiting occur, drink only clear liquids until the nausea and/or vomiting subsides. Call your physician if vomiting continues.  Special Instructions/Symptoms: Your throat may feel dry or sore from the anesthesia or the breathing tube placed in your throat during surgery. If this causes discomfort, gargle with warm salt water. The discomfort should disappear within 24 hours.  If you had a scopolamine patch placed behind your ear for the management of post- operative nausea and/or vomiting:  1. The medication in the patch is effective for 72 hours, after which it should be removed.  Wrap patch in a tissue and discard in the trash. Wash hands thoroughly with soap and water. 2. You may remove the patch earlier than 72 hours if you experience unpleasant side effects which may include dry mouth, dizziness or visual disturbances. 3. Avoid touching the patch. Wash your hands with soap and water after contact with the patch.        Next dose of Tylenol can be given after 6:41PM. Next dose of NSAID (Ibuprofen, Motrin, Aleve) can be given after 9:33PM.

## 2020-11-14 NOTE — Anesthesia Preprocedure Evaluation (Addendum)
Anesthesia Evaluation  Patient identified by MRN, date of birth, ID band Patient awake    Reviewed: Allergy & Precautions, NPO status , Patient's Chart, lab work & pertinent test results  History of Anesthesia Complications Negative for: history of anesthetic complications  Airway Mallampati: II  TM Distance: >3 FB Neck ROM: Full    Dental no notable dental hx.    Pulmonary former smoker,    Pulmonary exam normal        Cardiovascular negative cardio ROS Normal cardiovascular exam     Neuro/Psych negative neurological ROS  negative psych ROS   GI/Hepatic negative GI ROS, Neg liver ROS,   Endo/Other  negative endocrine ROS  Renal/GU negative Renal ROS  negative genitourinary   Musculoskeletal negative musculoskeletal ROS (+)   Abdominal   Peds  Hematology negative hematology ROS (+)   Anesthesia Other Findings Day of surgery medications reviewed with patient.  Reproductive/Obstetrics Undesired fertility                            Anesthesia Physical Anesthesia Plan  ASA: 1  Anesthesia Plan: General   Post-op Pain Management:    Induction: Intravenous  PONV Risk Score and Plan: 4 or greater and Treatment may vary due to age or medical condition, Ondansetron, Dexamethasone, Midazolam and Scopolamine patch - Pre-op  Airway Management Planned: Oral ETT  Additional Equipment: None  Intra-op Plan:   Post-operative Plan: Extubation in OR  Informed Consent: I have reviewed the patients History and Physical, chart, labs and discussed the procedure including the risks, benefits and alternatives for the proposed anesthesia with the patient or authorized representative who has indicated his/her understanding and acceptance.     Dental advisory given  Plan Discussed with: CRNA  Anesthesia Plan Comments:        Anesthesia Quick Evaluation

## 2020-11-14 NOTE — Telephone Encounter (Signed)
Left patient a message with appointment information for 12/05/2020.

## 2020-11-16 ENCOUNTER — Encounter (HOSPITAL_BASED_OUTPATIENT_CLINIC_OR_DEPARTMENT_OTHER): Payer: Self-pay | Admitting: Family Medicine

## 2020-11-16 LAB — SURGICAL PATHOLOGY

## 2020-12-05 ENCOUNTER — Other Ambulatory Visit: Payer: Self-pay

## 2020-12-05 ENCOUNTER — Telehealth: Payer: Managed Care, Other (non HMO) | Admitting: Family Medicine

## 2020-12-12 ENCOUNTER — Telehealth (INDEPENDENT_AMBULATORY_CARE_PROVIDER_SITE_OTHER): Payer: Managed Care, Other (non HMO) | Admitting: Family Medicine

## 2020-12-12 ENCOUNTER — Encounter: Payer: Self-pay | Admitting: Family Medicine

## 2020-12-12 ENCOUNTER — Other Ambulatory Visit: Payer: Self-pay

## 2020-12-12 DIAGNOSIS — Z09 Encounter for follow-up examination after completed treatment for conditions other than malignant neoplasm: Secondary | ICD-10-CM

## 2020-12-12 NOTE — Progress Notes (Signed)
I connected with  Deborah Edwards on 12/12/20 at  3:10 PM EDT by telephone and verified that I am speaking with the correct person using two identifiers.   I discussed the limitations, risks, security and privacy concerns of performing an evaluation and management service by telephone and the availability of in person appointments. I also discussed with the patient that there may be a patient responsible charge related to this service. The patient expressed understanding and agreed to proceed.  Crosby Oyster, RN   3:26 PM

## 2020-12-12 NOTE — Progress Notes (Signed)
,     GYNECOLOGY VIRTUAL VISIT ENCOUNTER NOTE  Provider location: [facility] [place] [facility]   Patient location: Home  I connected with Deborah Edwards on 12/12/20 at  3:10 PM EDT by MyChart Video Encounter and verified that I am speaking with the correct person using two identifiers.   I discussed the limitations, risks, security and privacy concerns of performing an evaluation and management service virtually and the availability of in person appointments. I also discussed with the patient that there may be a patient responsible charge related to this service. The patient expressed understanding and agreed to proceed.   History:  Deborah Edwards is a 40 y.o. [id number] female being evaluated today for post op check following bilateral salpingectomy she is recovering well without complaints. She denies any abnormal vaginal discharge, bleeding, pelvic pain or other concerns.       Past Medical History:  Diagnosis Date   Abnormal Pap smear    colpo ok; 2011   COVID-19    Irregular heart beat    Past Surgical History:  Procedure Laterality Date   LAPAROSCOPIC BILATERAL SALPINGECTOMY Bilateral 11/14/2020   Procedure: LAPAROSCOPIC BILATERAL SALPINGECTOMY;  Surgeon: [name], MD;  Location: [place];  Service: Gynecology;  Laterality: Bilateral;   WISDOM TOOTH EXTRACTION     The following portions of the patient's history were reviewed and updated as appropriate: allergies, current medications, past family history, past medical history, past social history, past surgical history and problem list.   Health Maintenance:  Normal pap and negative HRHPV on 12/03/2017.    Review of Systems:  Pertinent items noted in HPI and remainder of comprehensive ROS otherwise negative.  Physical Exam:   General:  Alert, oriented and cooperative. Patient appears to be in no acute distress.  Mental Status: Normal mood and affect. Normal behavior. Normal judgment and  thought content.   Respiratory: Normal respiratory effort, no problems with respiration noted  Rest of physical exam deferred due to type of encounter  Labs and Imaging No results found for this or any previous visit (from the past 336 hour(s)). No results found.     Assessment and Plan:     Postop check - Doing well, reviewed pathology--Needs pap, to schedule.        I discussed the assessment and treatment plan with the patient. The patient was provided an opportunity to ask questions and all were answered. The patient agreed with the plan and demonstrated an understanding of the instructions.   The patient was advised to call back or seek an in-person evaluation/go to the ED if the symptoms worsen or if the condition fails to improve as anticipated.  I provided 5 minutes of face-to-face time during this encounter.   [name], MD [facility], [place]
